# Patient Record
Sex: Male | Born: 1985
Health system: Southern US, Community
[De-identification: ages and names within clinical notes are randomized; demographics above are authoritative.]

## PROBLEM LIST (undated history)

## (undated) DIAGNOSIS — C801 Malignant (primary) neoplasm, unspecified: Secondary | ICD-10-CM

---

## 2018-03-28 DIAGNOSIS — E6609 Other obesity due to excess calories: Secondary | ICD-10-CM | POA: Diagnosis not present

## 2018-03-28 DIAGNOSIS — Z6833 Body mass index (BMI) 33.0-33.9, adult: Secondary | ICD-10-CM | POA: Diagnosis not present

## 2018-03-28 DIAGNOSIS — Z0000 Encounter for general adult medical examination without abnormal findings: Secondary | ICD-10-CM | POA: Diagnosis not present

## 2018-11-20 DIAGNOSIS — Z6830 Body mass index (BMI) 30.0-30.9, adult: Secondary | ICD-10-CM | POA: Diagnosis not present

## 2018-11-20 DIAGNOSIS — K5900 Constipation, unspecified: Secondary | ICD-10-CM | POA: Diagnosis not present

## 2018-11-20 DIAGNOSIS — K4090 Unilateral inguinal hernia, without obstruction or gangrene, not specified as recurrent: Secondary | ICD-10-CM | POA: Diagnosis not present

## 2018-11-20 DIAGNOSIS — K469 Unspecified abdominal hernia without obstruction or gangrene: Secondary | ICD-10-CM | POA: Diagnosis not present

## 2018-11-21 ENCOUNTER — Other Ambulatory Visit (HOSPITAL_COMMUNITY): Payer: Self-pay | Admitting: Physician Assistant

## 2018-11-21 ENCOUNTER — Other Ambulatory Visit: Payer: Self-pay | Admitting: Physician Assistant

## 2018-11-21 DIAGNOSIS — K5900 Constipation, unspecified: Secondary | ICD-10-CM

## 2018-11-21 DIAGNOSIS — K4090 Unilateral inguinal hernia, without obstruction or gangrene, not specified as recurrent: Secondary | ICD-10-CM

## 2018-11-26 ENCOUNTER — Other Ambulatory Visit: Payer: Self-pay

## 2018-11-26 ENCOUNTER — Ambulatory Visit (HOSPITAL_COMMUNITY)
Admission: RE | Admit: 2018-11-26 | Discharge: 2018-11-26 | Disposition: A | Discharge status: Home / Self Care | Payer: BC Managed Care – PPO | Source: Ambulatory Visit | Attending: Physician Assistant | Admitting: Physician Assistant

## 2018-11-26 DIAGNOSIS — K4090 Unilateral inguinal hernia, without obstruction or gangrene, not specified as recurrent: Secondary | ICD-10-CM | POA: Diagnosis not present

## 2018-11-26 DIAGNOSIS — K5900 Constipation, unspecified: Secondary | ICD-10-CM | POA: Diagnosis not present

## 2020-02-15 IMAGING — CT CT ABDOMEN AND PELVIS WITHOUT CONTRAST
2 of 4 series · 15 of 46 positions shown, 17 images · non-contrast
Comparison: None.

CLINICAL DATA: Constipation x2 months, right groin pain, evaluate
for hernia

EXAM:
CT ABDOMEN AND PELVIS WITHOUT CONTRAST
TECHNIQUE: Multidetector CT imaging of the abdomen and pelvis was performed
following the standard protocol without IV contrast.

[Series 2: axial st · axial · 0.73mm/px · z∈[-563,-83]mm · 12 of 108 slices shown, 14 images]
[im 6/108  soft-tissue]
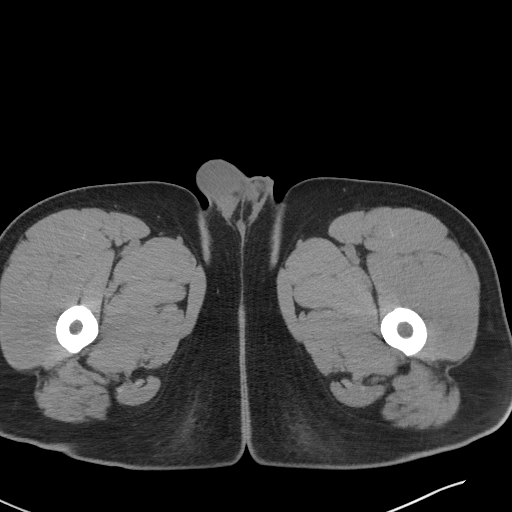
[im 6/108  bone]
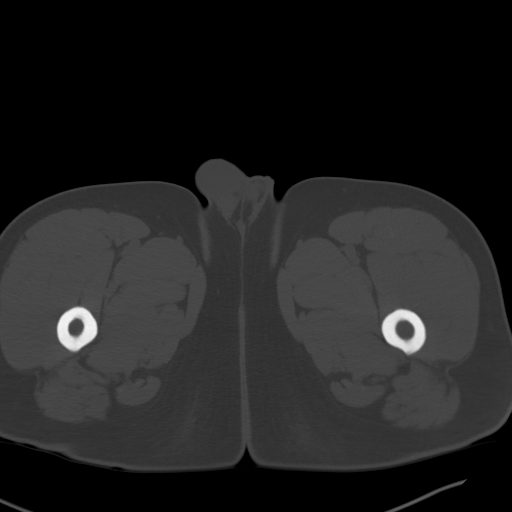
[im 17/108  soft-tissue]
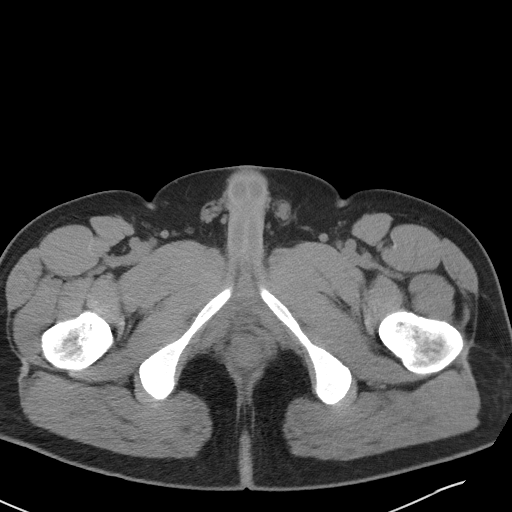
[im 22/108  soft-tissue]
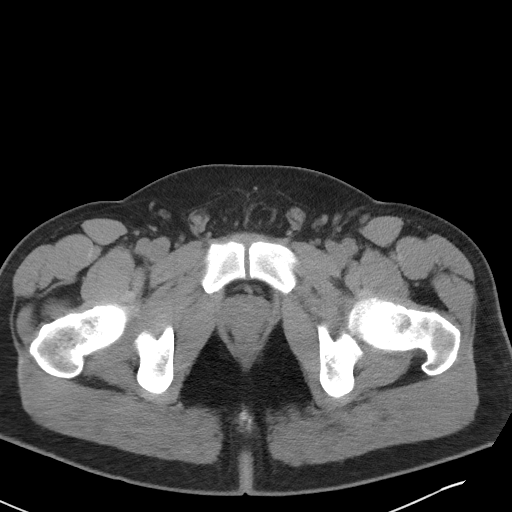
[im 33/108  soft-tissue]
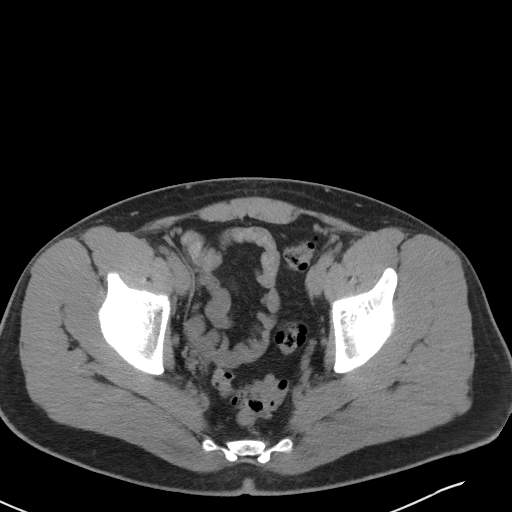
[im 43/108  soft-tissue]
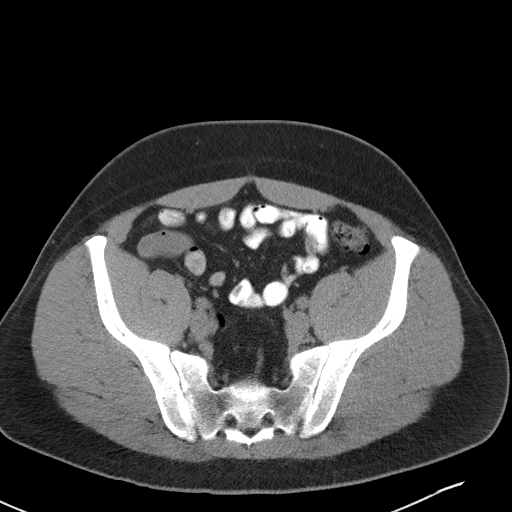
[im 49/108  soft-tissue]
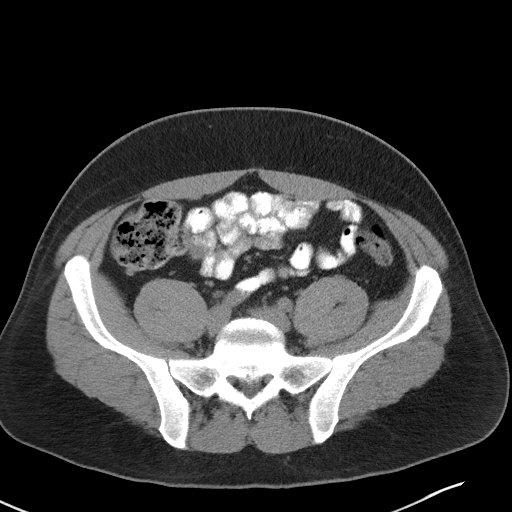
[im 59/108  soft-tissue]
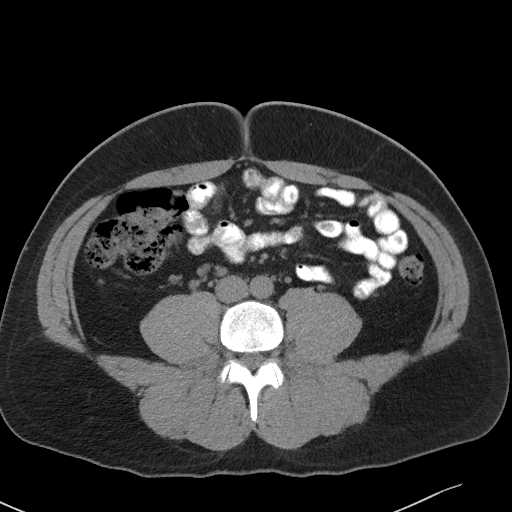
[im 65/108  soft-tissue]
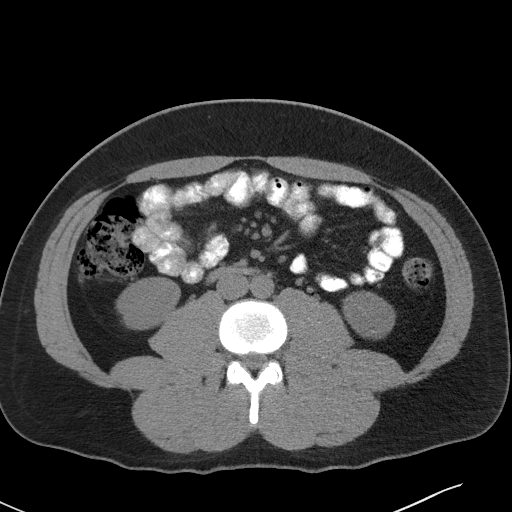
[im 75/108  soft-tissue]
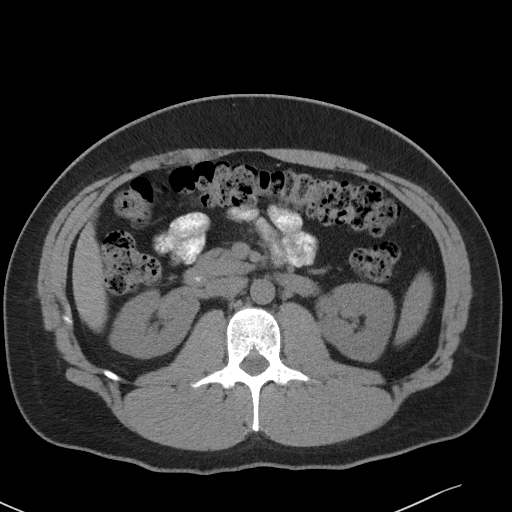
[im 75/108  bone]
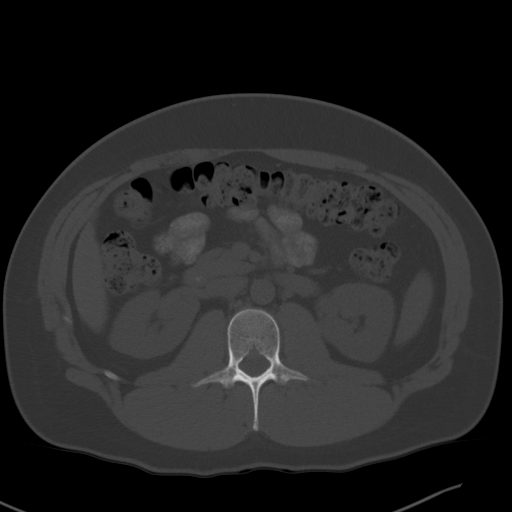
[im 86/108  soft-tissue]
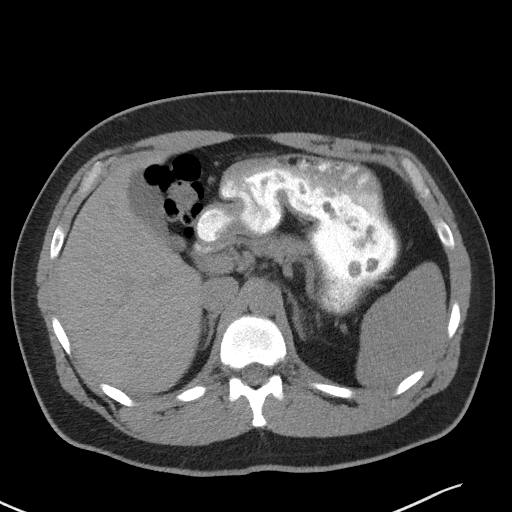
[im 91/108  soft-tissue]
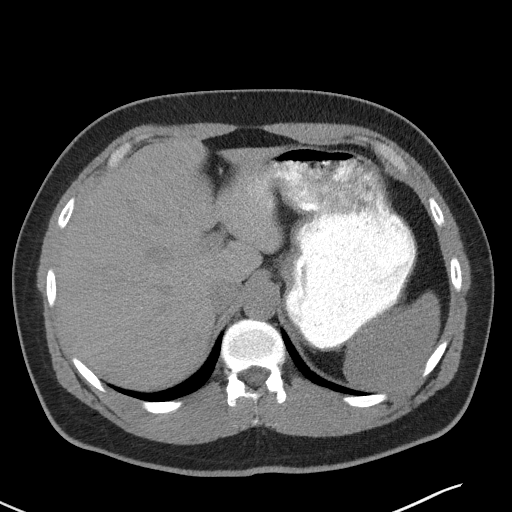
[im 102/108  soft-tissue]
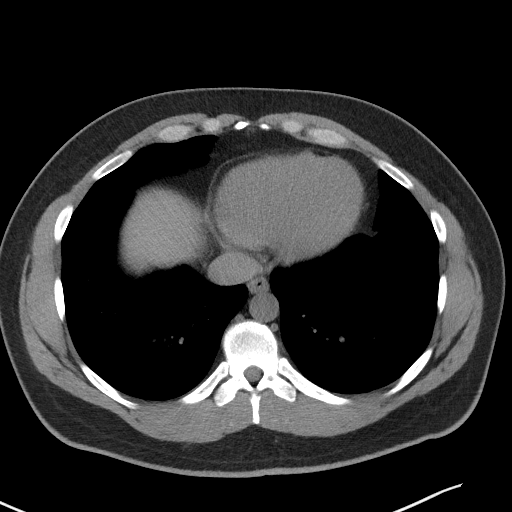

[Series 5: coronal st · coronal · 0.67mm/px · 3 of 95 slices shown]
[im 32/95  soft-tissue]
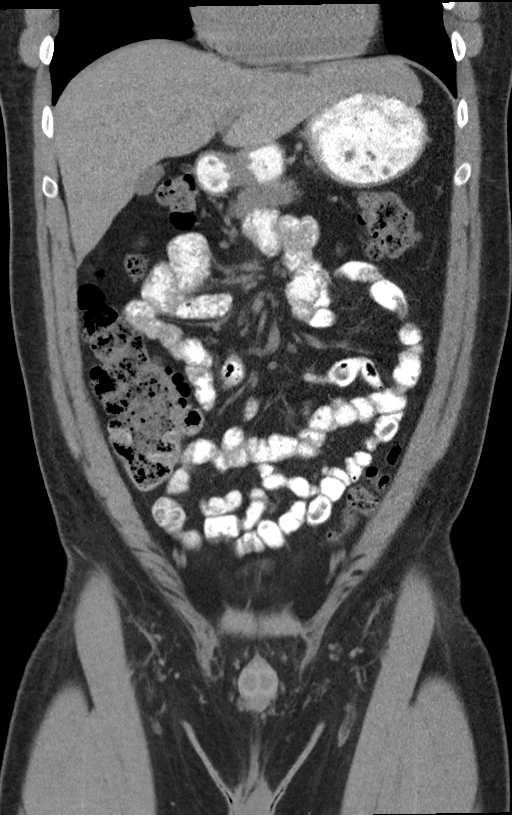
[im 42/95  soft-tissue]
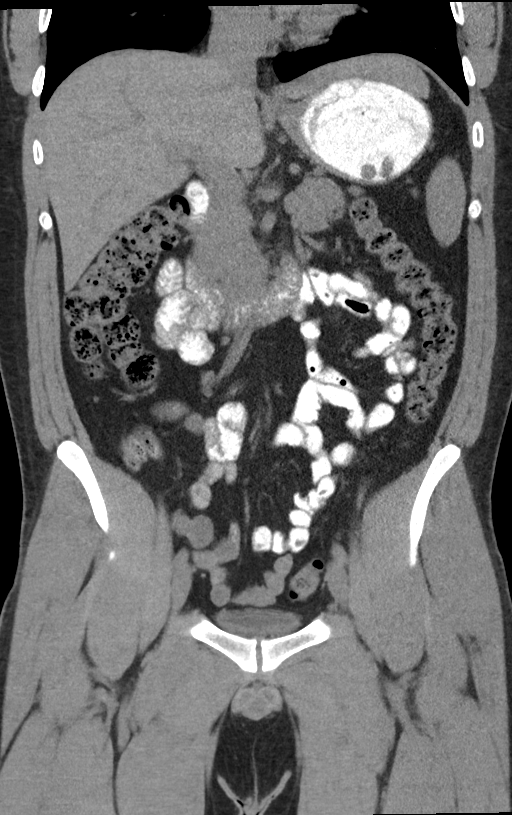
[im 53/95  soft-tissue]
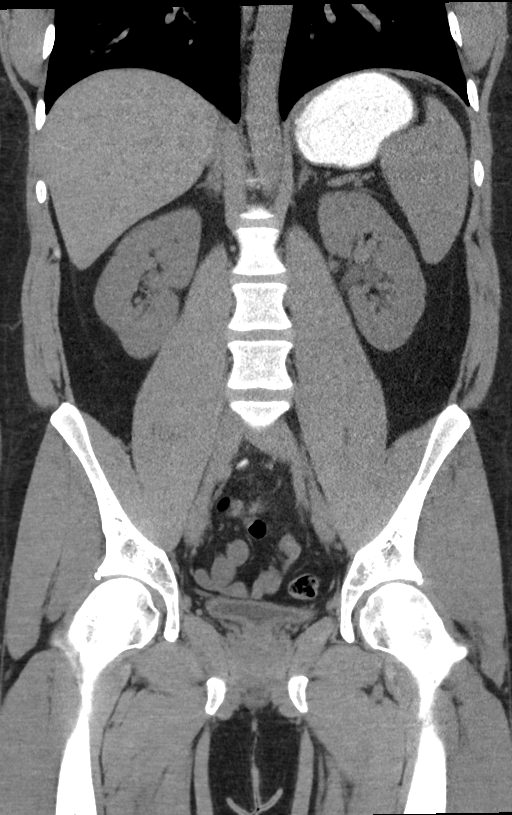

[15 of 46 positions shown; findings below may reference images not displayed]

FINDINGS: Lower chest: Lung bases are clear.

Hepatobiliary: Unenhanced liver is unremarkable.

Gallbladder is unremarkable. No intrahepatic or extrahepatic ductal
dilatation.

Pancreas: Within normal limits.

Spleen: Within normal limits.

Adrenals/Urinary Tract: Adrenal glands are within normal limits.

Kidneys are within normal limits. No renal, ureteral, or bladder
calculi. No hydronephrosis.

Bladder is mildly thick-walled although underdistended.

Stomach/Bowel: Stomach is within normal limits.

No evidence of bowel obstruction.

Normal appendix (series 2/image 56).

No colonic wall thickening or inflammatory changes. Normal colonic
stool burden.

Vascular/Lymphatic: No evidence of abdominal aortic aneurysm.

No suspicious abdominopelvic lymphadenopathy.

Reproductive: Prostate is unremarkable.

Other: No abdominopelvic ascites.

No evidence of ventral or inguinal hernia.

Musculoskeletal: Visualized osseous structures are within normal
limits.
IMPRESSION: Negative CT abdomen/pelvis.

No evidence of ventral or inguinal hernia.

## 2020-08-25 ENCOUNTER — Other Ambulatory Visit: Payer: Self-pay

## 2020-08-25 ENCOUNTER — Encounter (HOSPITAL_COMMUNITY): Payer: Self-pay

## 2020-08-25 ENCOUNTER — Emergency Department (HOSPITAL_COMMUNITY)
Admission: EM | Admit: 2020-08-25 | Discharge: 2020-08-25 | Disposition: A | Discharge status: Other Acute Inpatient Hospital | Payer: 59 | Attending: Emergency Medicine | Admitting: Emergency Medicine

## 2020-08-25 DIAGNOSIS — R000 Tachycardia, unspecified: Secondary | ICD-10-CM | POA: Insufficient documentation

## 2020-08-25 DIAGNOSIS — D649 Anemia, unspecified: Secondary | ICD-10-CM | POA: Diagnosis not present

## 2020-08-25 DIAGNOSIS — R42 Dizziness and giddiness: Secondary | ICD-10-CM | POA: Diagnosis present

## 2020-08-25 LAB — URINALYSIS, ROUTINE W REFLEX MICROSCOPIC
Bilirubin Urine: NEGATIVE
Glucose, UA: NEGATIVE mg/dL
Ketones, ur: NEGATIVE mg/dL
Leukocytes,Ua: NEGATIVE
Nitrite: NEGATIVE
Protein, ur: 100 mg/dL — AB
Specific Gravity, Urine: 1.023 (ref 1.005–1.030)
pH: 6 (ref 5.0–8.0)

## 2020-08-25 LAB — CBC WITH DIFFERENTIAL/PLATELET
Abs Immature Granulocytes: 0 10*3/uL (ref 0.00–0.07)
Basophils Absolute: 1.5 10*3/uL — ABNORMAL HIGH (ref 0.0–0.1)
Basophils Relative: 2 %
Blasts: 40 %
Eosinophils Absolute: 0 10*3/uL (ref 0.0–0.5)
Eosinophils Relative: 0 %
HCT: 18.7 % — ABNORMAL LOW (ref 39.0–52.0)
Hemoglobin: 6 g/dL — CL (ref 13.0–17.0)
Lymphocytes Relative: 19 %
Lymphs Abs: 14.4 10*3/uL — ABNORMAL HIGH (ref 0.7–4.0)
MCH: 31.9 pg (ref 26.0–34.0)
MCHC: 32.1 g/dL (ref 30.0–36.0)
MCV: 99.5 fL (ref 80.0–100.0)
Monocytes Absolute: 27.3 10*3/uL — ABNORMAL HIGH (ref 0.1–1.0)
Monocytes Relative: 36 %
Neutro Abs: 2.3 10*3/uL (ref 1.7–7.7)
Neutrophils Relative %: 3 %
Platelets: 19 10*3/uL — CL (ref 150–400)
RBC: 1.88 MIL/uL — ABNORMAL LOW (ref 4.22–5.81)
RDW: 14.8 % (ref 11.5–15.5)
Smear Review: NORMAL
WBC: 75.9 10*3/uL (ref 4.0–10.5)
nRBC: 0.1 % (ref 0.0–0.2)

## 2020-08-25 LAB — DIFFERENTIAL
Abs Immature Granulocytes: 0 10*3/uL (ref 0.00–0.07)
Basophils Absolute: 0 10*3/uL (ref 0.0–0.1)
Basophils Relative: 0 %
Blasts: 42 %
Eosinophils Absolute: 1.6 10*3/uL — ABNORMAL HIGH (ref 0.0–0.5)
Eosinophils Relative: 2 %
Lymphocytes Relative: 17 %
Lymphs Abs: 13.7 10*3/uL — ABNORMAL HIGH (ref 0.7–4.0)
Monocytes Absolute: 27.4 10*3/uL — ABNORMAL HIGH (ref 0.1–1.0)
Monocytes Relative: 34 %
Neutro Abs: 4 10*3/uL (ref 1.7–7.7)
Neutrophils Relative %: 5 %

## 2020-08-25 LAB — CBC
HCT: 18.5 % — ABNORMAL LOW (ref 39.0–52.0)
Hemoglobin: 6 g/dL — CL (ref 13.0–17.0)
MCH: 32.1 pg (ref 26.0–34.0)
MCHC: 32.4 g/dL (ref 30.0–36.0)
MCV: 98.9 fL (ref 80.0–100.0)
Platelets: 18 10*3/uL — CL (ref 150–400)
RBC: 1.87 MIL/uL — ABNORMAL LOW (ref 4.22–5.81)
RDW: 14.7 % (ref 11.5–15.5)
WBC: 80.5 10*3/uL (ref 4.0–10.5)
nRBC: 0.1 % (ref 0.0–0.2)

## 2020-08-25 LAB — TYPE AND SCREEN
ABO/RH(D): O POS
Antibody Screen: NEGATIVE

## 2020-08-25 LAB — BASIC METABOLIC PANEL
Anion gap: 7 (ref 5–15)
BUN: 16 mg/dL (ref 6–20)
CO2: 26 mmol/L (ref 22–32)
Calcium: 8.9 mg/dL (ref 8.9–10.3)
Chloride: 104 mmol/L (ref 98–111)
Creatinine, Ser: 1.09 mg/dL (ref 0.61–1.24)
GFR, Estimated: >60 mL/min (ref >60)
Glucose, Bld: 113 mg/dL — ABNORMAL HIGH (ref 70–99)
Potassium: 4.1 mmol/L (ref 3.5–5.1)
Sodium: 137 mmol/L (ref 135–145)

## 2020-08-25 LAB — PATHOLOGIST SMEAR REVIEW

## 2020-08-25 LAB — POC OCCULT BLOOD, ED: Fecal Occult Bld: NEGATIVE

## 2020-08-25 NOTE — ED Provider Notes (Signed)
[facility] EMERGENCY DEPARTMENT Provider Note   CSN: [id number] Arrival date & time: 08/25/20  1103     History Chief Complaint  Patient presents with  . Dizziness   Zachary Henry is a 35 y.o. male without significant PMH presents to ER for evaluation of light headedness for the last few weeks.  Mostly on exertion when standing up, walking.  Has come close to passing out.  Also reports shortness of breath with exertion but attributes this to being overweight.  Went to his PCP Dr Collene Mares in [place] who obtained lab work yesterday and called him today to let him know that his Hgb was 6 and needed to come to the ER.  He was given 25 mg metoprolol for his heart rate that has been fluctuating from 90-130s with exertion.  He only took one does of metoprolol.  He is not on any other medicines. He denies fever greater than 100.4 but has felt subjective fevers.  No unintentional weight. No Cp, SOB, cough. No unexplained bleeding including gum bleeding, hematuria, blood in stool or melena.  Just recently noticed a bruise in inner left thigh but doesn't know how he got it.  He got scratched/bit by his cat weeks ago, wound in left thigh and top of right foot are healing.    HPI     History reviewed. No pertinent past medical history.  There are no problems to display for this patient.   History reviewed. No pertinent surgical history.     History reviewed. No pertinent family history.  Social History   Tobacco Use  . Smoking status: Never Smoker  . Smokeless tobacco: Never Used    Home Medications Prior to Admission medications   Not on File    Allergies    Patient has no allergy information on record.  Review of Systems   Review of Systems  Respiratory: Positive for shortness of breath.   Skin: Positive for wound.  Neurological: Positive for light-headedness.  All other systems reviewed and are negative.   Physical Exam Updated Vital Signs BP 137/87    Pulse (!) 106   Temp 99.5 F (37.5 C) (Oral)   Resp 14   Ht $R'5\' 9"'Km$  (1.753 m)   Wt 99.8 kg   SpO2 99%   BMI 32.49 kg/m   Physical Exam Vitals and nursing note reviewed.  Constitutional:      General: He is not in acute distress.    Appearance: He is well-developed.     Comments: Pallor  HENT:     Head: Normocephalic and atraumatic.     Right Ear: External ear normal.     Left Ear: External ear normal.     Nose: Nose normal.     Mouth/Throat:     Comments: Poor dentition throughout, multiple missing teeth and decay. Upper gumline with gingival hypertrophy noted. Single petechial lesion right buccal mucosa  Eyes:     General: No scleral icterus.    Conjunctiva/sclera: Conjunctivae normal.  Cardiovascular:     Rate and Rhythm: Normal rate and regular rhythm.     Heart sounds: Normal heart sounds. No murmur heard.   Pulmonary:     Effort: Pulmonary effort is normal.     Breath sounds: Normal breath sounds. No wheezing.  Abdominal:     Palpations: Abdomen is soft.     Tenderness: There is no abdominal tenderness.  Musculoskeletal:        General: No deformity. Normal range of motion.  Cervical back: Normal range of motion and neck supple.  Skin:    General: Skin is warm and dry.     Capillary Refill: Capillary refill takes less than 2 seconds.     Comments: Left medial thigh has non blanchable purpura, non tender.  Posterior left thigh and dorsal right foot with healing ecchymotic non tender abrasions (c/w patient's recent cat bite/scratch)  Neurological:     Mental Status: He is alert and oriented to person, place, and time.  Psychiatric:        Behavior: Behavior normal.        Thought Content: Thought content normal.        Judgment: Judgment normal.     ED Results / Procedures / Treatments   Labs (all labs ordered are listed, but only abnormal results are displayed) Labs Reviewed  BASIC METABOLIC PANEL - Abnormal; Notable for the following components:       Result Value   Glucose, Bld 113 (*)    All other components within normal limits  CBC - Abnormal; Notable for the following components:   WBC 80.5 (*)    RBC 1.87 (*)    Hemoglobin 6.0 (*)    HCT 18.5 (*)    Platelets 18 (*)    All other components within normal limits  URINALYSIS, ROUTINE W REFLEX MICROSCOPIC - Abnormal; Notable for the following components:   APPearance CLOUDY (*)    Hgb urine dipstick SMALL (*)    Protein, ur 100 (*)    Bacteria, UA MANY (*)    All other components within normal limits  PATHOLOGIST SMEAR REVIEW  CBC WITH DIFFERENTIAL/PLATELET  DIFFERENTIAL  POC OCCULT BLOOD, ED  TYPE AND SCREEN  ABO/RH    EKG EKG Interpretation  Date/Time:  Wednesday Aug 25 2020 11:07:45 EDT Ventricular Rate:  108 PR Interval:  146 QRS Duration: 80 QT Interval:  342 QTC Calculation: 458 R Axis:   62 Text Interpretation: Sinus tachycardia Otherwise normal ECG No old tracing to compare Confirmed by Deno Etienne [id number]) on 08/25/2020 11:25:49 AM   Radiology No results found.  Procedures .Critical Care Performed by: Kinnie Feil, PA-C Authorized by: Kinnie Feil, PA-C   Critical care provider statement:    Critical care time (minutes):  45   Critical care was necessary to treat or prevent imminent or life-threatening deterioration of the following conditions: acute leukemia.   Critical care was time spent personally by me on the following activities:  Discussions with consultants, evaluation of patient's response to treatment, examination of patient, ordering and performing treatments and interventions, ordering and review of laboratory studies, ordering and review of radiographic studies, pulse oximetry, re-evaluation of patient's condition, obtaining history from patient or surrogate, review of old charts and development of treatment plan with patient or surrogate   I assumed direction of critical care for this patient from another provider in my specialty:  no       Medications Ordered in ED Medications - No data to display  ED Course  I have reviewed the triage vital signs and the nursing notes.  Pertinent labs & imaging results that were available during my care of the patient were reviewed by me and considered in my medical decision making (see chart for details).  Clinical Course as of 08/25/20 1506  Wed Aug 25, 2020  1318 Spoke to pathologist - smear WBC 80, normocytic anemia, thrombocytopenia. Monocytosis increased blasts. Needs bone marrow biopsy. Flocytometry will be held off in case he  go[facility] so it can be done there.  Findings worrisome for leukemia. [phone]. Dr Jori Moll  [CG]  [phone] Spoke to [facility] Dr Larose Hires  [CG]  [phone] Pathologist smear review Marked abnormal monocytosis with increased blasts/promonocytes (approximately 30%). Normocytic anemia and thrombocytopenia. Bone marrow evaluation and flow cytometric analysis is recommended. Reported to Carmon Sails on 08/25/20 at 1325 by S. O'Neill. [CG]  1432 50-80% monocytes  [CG]  1446 Spoke to Dr Lissa Merlin who accepts transfer to leukemia service [CG]  1453 Updated patient on planned [facility] leukemia service, Dr Lissa Merlin accepted  [CG]    Clinical Course User Index [CG] Arlean Hopping   MDM Rules/Calculators/A&P                          35 y.o. yo male presents to the ED for evaluation of lightheadedness for weeks.  Had an abnormal lab by PCP yesterday.  Additional information obtained from chart, nursing and triage notes review  Chart review reveals -no recent available medical records pertinent  Ordered lab, imaging were personally reviewed and interpreted  Labs reveal -WBC 80.5, hemoglobin 6.0, platelets 18.  CBG 114. Electrolytes otherwise unremarkable. Hemoccult negative. UA with many bacteria, but no leukocytes, nitrites, WBC, RBCs.   Imaging reveals -EKG reveals sinus tachycardia, no ischemic changes, arrhythmias.   Medicines ordered - none  ED course & MDM    1318: Pathologist Dr. Davina Poke called me and reported patient has marked abnormal monocytosis with increased blasts approximately 30%, normocytic anemia, thrombocytopenia and recommending bone marrow biopsy and flow cytometry.  Findings are suspicious for acute leukemia.  I spoke to lab tech several times regarding pending differential.  They stated that because the sample had so many blasts they were not able to count them and they could only provide the pathologist report.  They called me back and stated that they will "try" to have 2 techs try to do the count.  They could not tell me approximately how long this would take.   1345: I spoke to Dr. Earlie Server with hematology/oncology.  Agrees labs today are concerning for acute leukemia.  He recommends stat transfer to [facility] for further work-up.  He does not recommend any additional blood work, imaging or medicines in the emergency department.  1455: I spoke to Dr. Lissa Merlin [facility] oncology.  She is accepting patient transfer to leukemia service.  1500: Patient reevaluated.  Remains transiently tachycardic.  Temp 99.5, RN to recheck this.  Patient and wife updated on plan to transfer to [facility] for ongoing care.  I explained to them that his lab work is suspicious for acute leukemia but further work-up and evaluation is still pending.  Appropriately concerned.  Discussed with EDP Tyrone Nine.  Portions of this note were generated with [employer]. Dictation errors may occur despite best attempts at proofreading    Final Clinical Impression(s) / ED Diagnoses Final diagnoses:  Anemia, unspecified type    Rx / DC Orders ED Discharge Orders    None       Kinnie Feil, PA-C 08/25/20 [place], DO 08/25/20 1512

## 2020-08-25 NOTE — ED Notes (Signed)
Pt aware we need urine sample and urinal at bedside.

## 2020-08-25 NOTE — ED Triage Notes (Addendum)
Pt reports for the past month he has been experiencing  dizziness.Pt reports he went to his PCP multiple times and recently they decided to do blood work and his hemoglobin level was low. Pt reports dizziness, heart racing. Pt reports hemoglobin 6.2 per PCP

## 2020-09-02 ENCOUNTER — Ambulatory Visit (HOSPITAL_COMMUNITY): Payer: BC Managed Care – PPO | Admitting: Hematology

## 2020-10-07 ENCOUNTER — Encounter (HOSPITAL_COMMUNITY): Payer: Self-pay

## 2020-10-07 ENCOUNTER — Other Ambulatory Visit: Payer: Self-pay

## 2020-10-07 ENCOUNTER — Emergency Department (HOSPITAL_COMMUNITY)
Admission: EM | Admit: 2020-10-07 | Discharge: 2020-10-08 | Disposition: A | Discharge status: Home / Self Care | Payer: 59 | Attending: Emergency Medicine | Admitting: Emergency Medicine

## 2020-10-07 DIAGNOSIS — Z859 Personal history of malignant neoplasm, unspecified: Secondary | ICD-10-CM | POA: Diagnosis not present

## 2020-10-07 DIAGNOSIS — R1013 Epigastric pain: Secondary | ICD-10-CM | POA: Diagnosis not present

## 2020-10-07 DIAGNOSIS — R112 Nausea with vomiting, unspecified: Secondary | ICD-10-CM | POA: Insufficient documentation

## 2020-10-07 HISTORY — DX: Malignant (primary) neoplasm, unspecified: C80.1

## 2020-10-07 NOTE — ED Triage Notes (Signed)
Pt is currently taking chemo (day40-50) for leukemia, pt had port placed to right chest on Monday. Pt presents to ED from home with c/o vomiting that started yesterday, pt says vomiting stopped yesterday, then started back this morning and "feels like stomach is in a knot"

## 2020-10-08 ENCOUNTER — Emergency Department (HOSPITAL_COMMUNITY): Payer: 59

## 2020-10-08 MED ORDER — IOHEXOL 9 MG/ML PO SOLN
ORAL | Status: AC
  Filled 2020-10-08: qty 500

## 2020-10-08 MED ORDER — MORPHINE SULFATE (PF) 4 MG/ML IV SOLN: 4.0000 mg | Freq: Once | INTRAVENOUS | Status: DC

## 2020-10-08 MED ORDER — ALUM & MAG HYDROXIDE-SIMETH 200-200-20 MG/5ML PO SUSP: 30.0000 mL | Freq: Once | ORAL | Status: DC

## 2020-10-08 MED ORDER — ONDANSETRON HCL 4 MG/2ML IJ SOLN: 4.0000 mg | Freq: Once | INTRAMUSCULAR | Status: DC

## 2020-10-08 MED ORDER — PANTOPRAZOLE SODIUM 40 MG IV SOLR: 40.0000 mg | Freq: Once | INTRAVENOUS | Status: DC

## 2020-10-08 NOTE — ED Notes (Signed)
Pt says he wants to leave and go to [facility] as all his care has been done there- Dr Roxanne Mins made aware and getting paperwork together for pt to be discharged.

## 2020-10-08 NOTE — ED Notes (Signed)
Several attempts for PIV access, one attempt with [place] unsuccessful- DR Roxanne Mins aware Per Dr Roxanne Mins okay to access power port.

## 2020-10-08 NOTE — Discharge Instructions (Addendum)
You have left before we could do any testing. You need to have your abdominal pain properly evaluated.

## 2020-10-08 NOTE — ED Provider Notes (Signed)
[facility] EMERGENCY DEPARTMENT Provider Note   CSN: [id number] Arrival date & time: 10/07/20  2116     History Chief Complaint  Patient presents with   Emesis    Alixander Rallis is a 35 y.o. male.  The history is provided by the patient.  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  Pain was present yesterday morning and improved after vomiting and was okay through the day and recurred this morning and has persisted.  Currently rates pain at 6/10.  There is no radiation of pain.  He has had ongoing nausea and vomiting.  He denies fever or chills.  There has been no treatment at home.  Nothing makes the pain better, nothing makes it worse.   Past Medical History:  Diagnosis Date   Cancer ([place])     There are no problems to display for this patient.   History reviewed. No pertinent surgical history.     No family history on file.  Social History   Tobacco Use   Smoking status: Never   Smokeless tobacco: Never  Substance Use Topics   Alcohol use: Never   Drug use: Never    Home Medications Prior to Admission medications   Not on File    Allergies    Codeine  Review of Systems   Review of Systems  Gastrointestinal:  Positive for vomiting.  All other systems reviewed and are negative.  Physical Exam Updated Vital Signs BP 121/86   Pulse 80   Temp 98.5 F (36.9 C) (Oral)   Resp (!) 27   Ht 5\' 11"  (1.803 m)   Wt 99.8 kg   SpO2 95%   BMI 30.68 kg/m   Physical Exam Vitals and nursing note reviewed.  35 year old male, appears uncomfortable, but is in no acute distress. Vital signs are significant for elevated respiratory rate. Oxygen saturation is 95%, which is normal. Head is normocephalic and atraumatic. PERRLA, EOMI. Oropharynx is clear. Neck is nontender and supple without adenopathy or JVD. Back is nontender and there is no CVA  tenderness. Lungs are clear without rales, wheezes, or rhonchi. Chest is nontender.  Mediport present in the right side. Heart has regular rate and rhythm without murmur. Abdomen is soft, flat, with moderate epigastric tenderness.  There is no rebound or guarding.  There are no masses or hepatosplenomegaly and peristalsis is hypoactive. Extremities have no cyanosis or edema, full range of motion is present. Skin is pale, warm and dry without rash. Neurologic: Mental status is normal, cranial nerves are intact, there are no motor or sensory deficits.  ED Results / Procedures / Treatments   Labs (all labs ordered are listed, but only abnormal results are displayed) Labs Reviewed  LIPASE, BLOOD  COMPREHENSIVE METABOLIC PANEL  CBC  URINALYSIS, ROUTINE W REFLEX MICROSCOPIC    EKG EKG Interpretation  Date/Time:  Thursday October 07 2020 23:52:48 EDT Ventricular Rate:  100 PR Interval:  158 QRS Duration: 95 QT Interval:  363 QTC Calculation: 469 R Axis:   26 Text Interpretation: Sinus tachycardia Borderline prolonged QT interval When compared with ECG of 08/25/2020, No significant change was found Confirmed by Delora Fuel ([id number]) on 10/08/2020 12:07:20 AM  Radiology No results found.  Procedures Procedures   Medications Ordered in ED Medications - No data to display  ED Course  I have reviewed the triage vital signs and the nursing notes.  Pertinent labs & imaging results that were available during my care of the patient were reviewed by me and considered in my medical decision making (see chart for details).   MDM Rules/Calculators/A&P                         Epigastric pain of uncertain cause.  It seems most likely to be peptic ulcer disease, consider pancreatitis, cholecystitis.  Old records are reviewed and he did have recent induction therapy for leukemia but counts had rebounded well and it seems unlikely to be related to his leukemia.  Work-up is ordered including labs, CT  scan as well as treatment with antiacid and pantoprazole.  There was difficulty obtaining IV access and his port was just recently inserted and he did not feel comfortable having had accessed here and patient has requested to be discharged so that he could go to [facility] where his leukemia care has been done.  I have offered to have him transferred by ambulance and he has declined.  Final Clinical Impression(s) / ED Diagnoses Final diagnoses:  Epigastric pain    Rx / DC Orders ED Discharge Orders     None        Delora Fuel, MD 15/40/08 [phone]

## 2023-03-30 ENCOUNTER — Encounter (HOSPITAL_COMMUNITY): Payer: Self-pay

## 2023-04-05 ENCOUNTER — Encounter (HOSPITAL_COMMUNITY)
Admission: RE | Admit: 2023-04-05 | Discharge: 2023-04-05 | Disposition: A | Discharge status: Home / Self Care | Payer: 59 | Source: Ambulatory Visit | Attending: Hematology and Oncology | Admitting: Hematology and Oncology

## 2023-04-05 VITALS — Wt 229.3 lb

## 2023-04-05 DIAGNOSIS — J84116 Cryptogenic organizing pneumonia: Secondary | ICD-10-CM | POA: Diagnosis present

## 2023-04-05 DIAGNOSIS — J9601 Acute respiratory failure with hypoxia: Secondary | ICD-10-CM | POA: Diagnosis present

## 2023-04-05 NOTE — Progress Notes (Signed)
Pulmonary Individual Treatment Plan  Patient Details  Name: Zachary Henry MRN: [id number] Date of Birth: September 21, 1985 Referring Provider:   Flowsheet Row PULMONARY REHAB OTHER RESP ORIENTATION from 04/05/2023 in [facility] CARDIAC REHABILITATION  Referring Provider wofford, anne MD       Initial Encounter Date:  Flowsheet Row PULMONARY REHAB OTHER RESP ORIENTATION from 04/05/2023 in [facility] CARDIAC REHABILITATION  Date 04/05/23       Visit Diagnosis: Acute respiratory failure with hypoxia (HCC)  Cryptogenic organizing pneumonia (HCC)  Patient's Home Medications on Admission:   Current Outpatient Medications:    acetaminophen (TYLENOL) 325 MG tablet, Take 650 mg by mouth every 4 (four) hours as needed for moderate pain (pain score 4-6)., Disp: , Rfl:    acyclovir (ZOVIRAX) 800 MG tablet, Take 800 mg by mouth 2 (two) times daily., Disp: , Rfl:    albuterol (VENTOLIN HFA) 108 (90 Base) MCG/ACT inhaler, Inhale 2 puffs into the lungs every 4 (four) hours as needed (SOB)., Disp: , Rfl:    chlorhexidine (PERIDEX) 0.12 % solution, Use as directed 5 mLs in the mouth or throat 2 (two) times daily., Disp: , Rfl:    CRESEMBA 186 MG CAPS, Take 2 capsules by mouth daily., Disp: , Rfl:    famotidine (PEPCID) 20 MG tablet, Take 20 mg by mouth daily as needed for heartburn., Disp: , Rfl:    fluticasone-salmeterol (ADVAIR) 250-50 MCG/ACT AEPB, Inhale 1 puff into the lungs in the morning and at bedtime., Disp: , Rfl:    folic acid (FOLVITE) 1 MG tablet, Take 1 mg by mouth daily., Disp: , Rfl:    Insulin Glargine (BASAGLAR KWIKPEN) 100 UNIT/ML, Inject 12 Units into the skin 2 (two) times daily., Disp: , Rfl:    insulin lispro (HUMALOG) 100 UNIT/ML KwikPen, Inject 6 Units into the skin 3 (three) times daily. Sliding scale, Disp: , Rfl:    metoprolol succinate (TOPROL-XL) 25 MG 24 hr tablet, Take 1 tablet by mouth daily., Disp: , Rfl:    Multiple Vitamin (MULTI-VITAMIN) tablet, Take 1 tablet by  mouth daily., Disp: , Rfl:    ondansetron (ZOFRAN-ODT) 8 MG disintegrating tablet, Take 8 mg by mouth every 8 (eight) hours as needed for nausea., Disp: , Rfl:    oxymetazoline (AFRIN) 0.05 % nasal spray, Place 2 sprays into the nose 2 (two) times daily as needed (Nose bleed)., Disp: , Rfl:    penicillin v potassium (VEETID) 500 MG tablet, Take 500 mg by mouth 2 (two) times daily., Disp: , Rfl:    predniSONE (DELTASONE) 10 MG tablet, Take 80 mg by mouth 2 (two) times daily., Disp: , Rfl:    PREVYMIS 480 MG TABS, Take 1 tablet by mouth daily., Disp: , Rfl:    SODIUM FLUORIDE 5000 PPM 1.1 % GEL dental gel, Place 1 Application onto teeth at bedtime., Disp: , Rfl:    Specialty Vitamins Products (MG PLUS PROTEIN) 133 MG TABS, Take 133 mg by mouth daily., Disp: , Rfl:    sulfamethoxazole-trimethoprim (BACTRIM DS) 800-160 MG tablet, Take 1 tablet by mouth 3 (three) times a week., Disp: , Rfl:    tacrolimus (PROGRAF) 0.5 MG capsule, Take 1 mg by mouth 2 (two) times daily., Disp: , Rfl:   Past Medical History: Past Medical History:  Diagnosis Date   Cancer (HCC)     Tobacco Use: Social History   Tobacco Use  Smoking Status Never  Smokeless Tobacco Never    Labs: Review Flowsheet  No data to display          Capillary Blood Glucose: No results found for: "GLUCAP"   Pulmonary Assessment Scores:  Pulmonary Assessment Scores     Row Name 04/05/23 1251         ADL UCSD   ADL Phase Entry     SOB Score total 19     Rest 0     Walk 1     Stairs 2     Bath 1     Dress 2     Shop 2       CAT Score   CAT Score 4       mMRC Score   mMRC Score 1             UCSD: Self-administered rating of dyspnea associated with activities of daily living (ADLs) 6-point scale (0 = "not at all" to 5 = "maximal or unable to do because of breathlessness")  Scoring Scores range from 0 to 120.  Minimally important difference is 5 units  CAT: CAT can identify the health impairment  of COPD patients and is better correlated with disease progression.  CAT has a scoring range of zero to 40. The CAT score is classified into four groups of low (less than 10), medium (10 - 20), high (21-30) and very high (31-40) based on the impact level of disease on health status. A CAT score over 10 suggests significant symptoms.  A worsening CAT score could be explained by an exacerbation, poor medication adherence, poor inhaler technique, or progression of COPD or comorbid conditions.  CAT MCID is 2 points  mMRC: mMRC (Modified Medical Research Council) Dyspnea Scale is used to assess the degree of baseline functional disability in patients of respiratory disease due to dyspnea. No minimal important difference is established. A decrease in score of 1 point or greater is considered a positive change.   Pulmonary Function Assessment:   Exercise Target Goals: Exercise Program Goal: Individual exercise prescription set using results from initial 6 min walk test and THRR while considering  patient's activity barriers and safety.   Exercise Prescription Goal: Initial exercise prescription builds to 30-45 minutes a day of aerobic activity, 2-3 days per week.  Home exercise guidelines will be given to patient during program as part of exercise prescription that the participant will acknowledge.  Activity Barriers & Risk Stratification:  Activity Barriers & Cardiac Risk Stratification - 04/05/23 1333       Activity Barriers & Cardiac Risk Stratification   Activity Barriers Shortness of Breath;Deconditioning;Muscular Weakness;Assistive Device;Balance Concerns    Cardiac Risk Stratification Low             6 Minute Walk:  6 Minute Walk     Row Name 04/05/23 1616         6 Minute Walk   Phase Initial     Distance 750 feet     Walk Time 6 minutes     # of Rest Breaks 0     MPH 1.42     METS 1.42     RPE 12     Perceived Dyspnea  2     VO2 Peak 14.36     Symptoms No      Resting HR 115 bpm     Resting BP 116/90     Resting Oxygen Saturation  96 %     Exercise Oxygen Saturation  during 6 min walk 94 %     Max Ex.  HR 154 bpm     Max Ex. BP 140/90     2 Minute Post BP 128/86       Interval HR   1 Minute HR 144     2 Minute HR 148     3 Minute HR 152     4 Minute HR 152     5 Minute HR 148     6 Minute HR 154     2 Minute Post HR 123     Interval Heart Rate? Yes       Interval Oxygen   Interval Oxygen? Yes     Baseline Oxygen Saturation % 96 %     1 Minute Oxygen Saturation % 94 %     1 Minute Liters of Oxygen 0 L     2 Minute Oxygen Saturation % 94 %     2 Minute Liters of Oxygen 0 L     3 Minute Oxygen Saturation % 95 %     3 Minute Liters of Oxygen 0 L     4 Minute Oxygen Saturation % 95 %     4 Minute Liters of Oxygen 0 L     5 Minute Oxygen Saturation % 94 %     5 Minute Liters of Oxygen 0 L     6 Minute Oxygen Saturation % 94 %     6 Minute Liters of Oxygen 0 L     2 Minute Post Oxygen Saturation % 95 %     2 Minute Post Liters of Oxygen 0 L              Oxygen Initial Assessment:  Oxygen Initial Assessment - 04/05/23 1335       Home Oxygen   Home Oxygen Device Home Concentrator;Portable Concentrator    Sleep Oxygen Prescription --   As Needed   Compliance with Home Oxygen Use Yes      Intervention   Short Term Goals To learn and exhibit compliance with exercise, home and travel O2 prescription;To learn and understand importance of monitoring SPO2 with pulse oximeter and demonstrate accurate use of the pulse oximeter.;To learn and understand importance of maintaining oxygen saturations>88%;To learn and demonstrate proper pursed lip breathing techniques or other breathing techniques. ;To learn and demonstrate proper use of respiratory medications    Long  Term Goals Exhibits compliance with exercise, home  and travel O2 prescription;Verbalizes importance of monitoring SPO2 with pulse oximeter and return demonstration;Maintenance  of O2 saturations>88%;Exhibits proper breathing techniques, such as pursed lip breathing or other method taught during program session;Compliance with respiratory medication;Demonstrates proper use of MDI's             Oxygen Re-Evaluation:   Oxygen Discharge (Final Oxygen Re-Evaluation):   Initial Exercise Prescription:  Initial Exercise Prescription - 04/05/23 1600       Date of Initial Exercise RX and Referring Provider   Date 04/05/23    Referring Provider wofford, anne MD      Oxygen   Oxygen Continuous    Maintain Oxygen Saturation 88% or higher      NuStep   Level 1    SPM 50    Minutes 15    METs 1.9      REL-XR   Level 1    Speed 50    Minutes 15    METs 1.8      Prescription Details   Frequency (times per week) 3    Duration Progress to 30  minutes of continuous aerobic without signs/symptoms of physical distress      Intensity   THRR 40-80% of Max Heartrate 143-170    Ratings of Perceived Exertion 11-13    Perceived Dyspnea 0-4             Perform Capillary Blood Glucose checks as needed.  Exercise Prescription Changes:   Exercise Prescription Changes     Row Name 04/05/23 1600             Response to Exercise   Blood Pressure (Admit) 116/90       Blood Pressure (Exercise) 140/90       Blood Pressure (Exit) 128/86       Heart Rate (Admit) 115 bpm       Heart Rate (Exercise) 154 bpm       Heart Rate (Exit) 123 bpm       Oxygen Saturation (Admit) 96 %       Oxygen Saturation (Exercise) 94 %       Oxygen Saturation (Exit) 95 %       Rating of Perceived Exertion (Exercise) 12       Perceived Dyspnea (Exercise) 2         Resistance Training   Weight 3lbs       Reps 10-15                Exercise Comments:   Exercise Goals and Review:   Exercise Goals     Row Name 04/05/23 1621             Exercise Goals   Increase Physical Activity Yes       Intervention Develop an individualized exercise prescription for  aerobic and resistive training based on initial evaluation findings, risk stratification, comorbidities and participant's personal goals.;Provide advice, education, support and counseling about physical activity/exercise needs.       Expected Outcomes Short Term: Attend rehab on a regular basis to increase amount of physical activity.;Long Term: Exercising regularly at least 3-5 days a week.;Long Term: Add in home exercise to make exercise part of routine and to increase amount of physical activity.       Increase Strength and Stamina Yes       Intervention Develop an individualized exercise prescription for aerobic and resistive training based on initial evaluation findings, risk stratification, comorbidities and participant's personal goals.;Provide advice, education, support and counseling about physical activity/exercise needs.       Expected Outcomes Short Term: Increase workloads from initial exercise prescription for resistance, speed, and METs.;Long Term: Improve cardiorespiratory fitness, muscular endurance and strength as measured by increased METs and functional capacity ( );Short Term: Perform resistance training exercises routinely during rehab and add in resistance training at home       Able to understand and use rate of perceived exertion (RPE) scale Yes       Intervention Provide education and explanation on how to use RPE scale       Expected Outcomes Short Term: Able to use RPE daily in rehab to express subjective intensity level;Long Term:  Able to use RPE to guide intensity level when exercising independently       Able to understand and use Dyspnea scale Yes       Intervention Provide education and explanation on how to use Dyspnea scale       Expected Outcomes Short Term: Able to use Dyspnea scale daily in rehab to express subjective sense of shortness of breath during exertion;Long Term: Able  to use Dyspnea scale to guide intensity level when exercising independently        Knowledge and understanding of Target Heart Rate Range (THRR) Yes       Intervention Provide education and explanation of THRR including how the numbers were predicted and where they are located for reference       Expected Outcomes Short Term: Able to state/look up THRR;Long Term: Able to use THRR to govern intensity when exercising independently;Short Term: Able to use daily as guideline for intensity in rehab       Able to check pulse independently Yes       Intervention Provide education and demonstration on how to check pulse in carotid and radial arteries.;Review the importance of being able to check your own pulse for safety during independent exercise       Expected Outcomes Short Term: Able to explain why pulse checking is important during independent exercise;Long Term: Able to check pulse independently and accurately       Understanding of Exercise Prescription Yes       Intervention Provide education, explanation, and written materials on patient's individual exercise prescription       Expected Outcomes Short Term: Able to explain program exercise prescription;Long Term: Able to explain home exercise prescription to exercise independently                Exercise Goals Re-Evaluation :   Discharge Exercise Prescription (Final Exercise Prescription Changes):  Exercise Prescription Changes - 04/05/23 1600       Response to Exercise   Blood Pressure (Admit) 116/90    Blood Pressure (Exercise) 140/90    Blood Pressure (Exit) 128/86    Heart Rate (Admit) 115 bpm    Heart Rate (Exercise) 154 bpm    Heart Rate (Exit) 123 bpm    Oxygen Saturation (Admit) 96 %    Oxygen Saturation (Exercise) 94 %    Oxygen Saturation (Exit) 95 %    Rating of Perceived Exertion (Exercise) 12    Perceived Dyspnea (Exercise) 2      Resistance Training   Weight 3lbs    Reps 10-15             Nutrition:  Target Goals: Understanding of nutrition guidelines, daily intake of sodium 1500mg ,  cholesterol 200mg , calories 30% from fat and 7% or less from saturated fats, daily to have 5 or more servings of fruits and vegetables.  Biometrics:  Pre Biometrics - 04/05/23 1621       Pre Biometrics   Weight 104 kg              Nutrition Therapy Plan and Nutrition Goals:   Nutrition Assessments:  MEDIFICTS Score Key: >=70 Need to make dietary changes  40-70 Heart Healthy Diet <= 40 Therapeutic Level Cholesterol Diet  Flowsheet Row PULMONARY REHAB OTHER RESP ORIENTATION from 04/05/2023 in [facility] CARDIAC REHABILITATION  Picture Your Plate Total Score on Admission 57      Picture Your Plate Scores: <16 Unhealthy dietary pattern with much room for improvement. 41-50 Dietary pattern unlikely to meet recommendations for good health and room for improvement. 51-60 More healthful dietary pattern, with some room for improvement.  >60 Healthy dietary pattern, although there may be some specific behaviors that could be improved.    Nutrition Goals Re-Evaluation:   Nutrition Goals Discharge (Final Nutrition Goals Re-Evaluation):   Psychosocial: Target Goals: Acknowledge presence or absence of significant depression and/or stress, maximize coping skills, provide positive support system.  Participant is able to verbalize types and ability to use techniques and skills needed for reducing stress and depression.  Initial Review & Psychosocial Screening:  Initial Psych Review & Screening - 04/05/23 1500       Initial Review   Current issues with None Identified      Family Dynamics   Good Support System? Yes    Comments His parents and wife are his support people.      Barriers   Psychosocial barriers to participate in program The patient should benefit from training in stress management and relaxation.;There are no identifiable barriers or psychosocial needs.      Screening Interventions   Interventions Encouraged to exercise;To provide support and resources with  identified psychosocial needs;Provide feedback about the scores to participant    Expected Outcomes Short Term goal: Utilizing psychosocial counselor, staff and physician to assist with identification of specific Stressors or current issues interfering with healing process. Setting desired goal for each stressor or current issue identified.;Long Term Goal: Stressors or current issues are controlled or eliminated.;Short Term goal: Identification and review with participant of any Quality of Life or Depression concerns found by scoring the questionnaire.;Long Term goal: The participant improves quality of Life and PHQ9 Scores as seen by post scores and/or verbalization of changes             Quality of Life Scores:  Scores of 19 and below usually indicate a poorer quality of life in these areas.  A difference of  2-3 points is a clinically meaningful difference.  A difference of 2-3 points in the total score of the Quality of Life Index has been associated with significant improvement in overall quality of life, self-image, physical symptoms, and general health in studies assessing change in quality of life.   PHQ-9: Review Flowsheet       04/05/2023  Depression screen PHQ 2/9  Decreased Interest 0  Down, Depressed, Hopeless 0  PHQ - 2 Score 0  Altered sleeping 3  Tired, decreased energy 1  Change in appetite 0  Feeling bad or failure about yourself  0  Trouble concentrating 0  Moving slowly or fidgety/restless 0  Suicidal thoughts 0  PHQ-9 Score 4  Difficult doing work/chores Not difficult at all   Interpretation of Total Score  Total Score Depression Severity:  1-4 = Minimal depression, 5-9 = Mild depression, 10-14 = Moderate depression, 15-19 = Moderately severe depression, 20-27 = Severe depression   Psychosocial Evaluation and Intervention:  Psychosocial Evaluation - 04/05/23 1501       Psychosocial Evaluation & Interventions   Interventions Stress management  education;Relaxation education;Encouraged to exercise with the program and follow exercise prescription    Comments Patient referred to PR with acute respiratory failure with hypoxia and pneumonia from [facility]. His PHQ-9 score was 4 due to trouble staying asleep and having little energy. Patient denies any depression, anxiety or major stressors in his life. 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 He went into remission after 6 months of chemotherapy and had a relapse in 1/24. He received a bone marrow transplant 6/24 and is in remission. He was doing well until developing pneumonia in November with acute respiratory failure requiring intubation and was hospitalized for 28 days. He requires w/c for long distance due to SOB and lower extremity muscle weakness from being immobile for a month. He had home PT and was discharged a few days ago but still does the  exercises.  He is in good spirits and says he is thankful to be alive. He lives with his wife and they have 2 children. 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 He is very motivated to participate in PR. His goals for the program are to breathe better; improve his overall mobility and balance; and be independent and get back to normal. He has no barriers identified to complete the program.    Expected Outcomes Short Term: Start the program and attend consistently. Long Term: meet his personal goals.    Continue Psychosocial Services  Follow up required by staff             Psychosocial Re-Evaluation:   Psychosocial Discharge (Final Psychosocial Re-Evaluation):    Education: Education Goals: Education classes will be provided on a weekly basis, covering required topics. Participant will state understanding/return demonstration of topics presented.  Learning Barriers/Preferences:  Learning Barriers/Preferences  - 04/05/23 1340       Learning Barriers/Preferences   Learning Barriers None    Learning Preferences Written Material;Audio;Skilled Demonstration             Education Topics: How Lungs Work and Diseases: - Discuss the anatomy of the lungs and diseases that can affect the lungs, such as COPD.   Exercise: -Discuss the importance of exercise, FITT principles of exercise, normal and abnormal responses to exercise, and how to exercise safely.   Environmental Irritants: -Discuss types of environmental irritants and how to limit exposure to environmental irritants.   Meds/Inhalers and oxygen: - Discuss respiratory medications, definition of an inhaler and oxygen, and the proper way to use an inhaler and oxygen.   Energy Saving Techniques: - Discuss methods to conserve energy and decrease shortness of breath when performing activities of daily living.    Bronchial Hygiene / Breathing Techniques: - Discuss breathing mechanics, pursed-lip breathing technique,  proper posture, effective ways to clear airways, and other functional breathing techniques   Cleaning Equipment: - Provides group verbal and written instruction about the health risks of elevated stress, cause of high stress, and healthy ways to reduce stress.   Nutrition I: Fats: - Discuss the types of cholesterol, what cholesterol does to the body, and how cholesterol levels can be controlled.   Nutrition II: Labels: -Discuss the different components of food labels and how to read food labels.   Respiratory Infections: - Discuss the signs and symptoms of respiratory infections, ways to prevent respiratory infections, and the importance of seeking medical treatment when having a respiratory infection.   Stress I: Signs and Symptoms: - Discuss the causes of stress, how stress may lead to anxiety and depression, and ways to limit stress.   Stress II: Relaxation: -Discuss relaxation techniques to limit  stress.   Oxygen for Home/Travel: - Discuss how to prepare for travel when on oxygen and proper ways to transport and store oxygen to ensure safety.   Knowledge Questionnaire Score:  Knowledge Questionnaire Score - 04/05/23 1251       Knowledge Questionnaire Score   Pre Score 16/18             Core Components/Risk Factors/Patient Goals at Admission:  Personal Goals and Risk Factors at Admission - 04/05/23 1454       Core Components/Risk Factors/Patient Goals on Admission    Weight Management Weight Maintenance;Obesity    Improve shortness of breath with ADL's Yes    Intervention Provide education,  individualized exercise plan and daily activity instruction to help decrease symptoms of SOB with activities of daily living.    Expected Outcomes Short Term: Improve cardiorespiratory fitness to achieve a reduction of symptoms when performing ADLs;Long Term: Be able to perform more ADLs without symptoms or delay the onset of symptoms    Diabetes Yes    Intervention Provide education about signs/symptoms and action to take for hypo/hyperglycemia.;Provide education about proper nutrition, including hydration, and aerobic/resistive exercise prescription along with prescribed medications to achieve blood glucose in normal ranges: Fasting glucose 65-99 mg/dL    Expected Outcomes Short Term: Participant verbalizes understanding of the signs/symptoms and immediate care of hyper/hypoglycemia, proper foot care and importance of medication, aerobic/resistive exercise and nutrition plan for blood glucose control.;Long Term: Attainment of HbA1C < 7%.    Heart Failure Yes    Intervention Provide a combined exercise and nutrition program that is supplemented with education, support and counseling about heart failure. Directed toward relieving symptoms such as shortness of breath, decreased exercise tolerance, and extremity edema.    Expected Outcomes Improve functional capacity of life;Short term:  Attendance in program 2-3 days a week with increased exercise capacity. Reported lower sodium intake. Reported increased fruit and vegetable intake. Reports medication compliance.;Short term: Daily weights obtained and reported for increase. Utilizing diuretic protocols set by physician.;Long term: Adoption of self-care skills and reduction of barriers for early signs and symptoms recognition and intervention leading to self-care maintenance.    Hypertension Yes    Intervention Provide education on lifestyle modifcations including regular physical activity/exercise, weight management, moderate sodium restriction and increased consumption of fresh fruit, vegetables, and low fat dairy, alcohol moderation, and smoking cessation.;Monitor prescription use compliance.    Expected Outcomes Short Term: Continued assessment and intervention until BP is < 140/50mm HG in hypertensive participants. < 130/64mm HG in hypertensive participants with diabetes, heart failure or chronic kidney disease.;Long Term: Maintenance of blood pressure at goal levels.             Core Components/Risk Factors/Patient Goals Review:    Core Components/Risk Factors/Patient Goals at Discharge (Final Review):    ITP Comments:   Comments: Patient arrived for 1st visit/orientation/education at 1300. Patient was referred to P R by Dr. Carley Hammed due to Acute Respiratory Failure with hypoxia and pneumonia. During orientation advised patient on arrival and appointment times what to wear, what to do before, during and after exercise. Reviewed attendance and class policy.  Pt is scheduled to return Pulmonary Rehab on 04/16/23 at 915. Pt was advised to come to class 15 minutes before class starts.  Discussed RPE/Dpysnea scales. Patient participated in warm up stretches. Patient was able to complete 6 minute walk test.  Patient was measured for the equipment. Discussed equipment safety with patient. Took patient pre-anthropometric  measurements. Patient finished visit at 1430.

## 2023-04-06 NOTE — Patient Instructions (Addendum)
Patient Instructions  Patient Details  Name: Zachary Henry MRN: [id number] Date of Birth: 06-Sep-1985 Referring Provider:  Ricarda Frame*  Below are your personal goals for exercise, nutrition, and risk factors. Our goal is to help you stay on track towards obtaining and maintaining these goals. We will be discussing your progress on these goals with you throughout the program.  Initial Exercise Prescription:  Initial Exercise Prescription - 04/05/23 1600       Date of Initial Exercise RX and Referring Provider   Date 04/05/23    Referring Provider wofford, anne MD      Oxygen   Oxygen Continuous    Maintain Oxygen Saturation 88% or higher      NuStep   Level 1    SPM 50    Minutes 15    METs 1.9      REL-XR   Level 1    Speed 50    Minutes 15    METs 1.8      Prescription Details   Frequency (times per week) 3    Duration Progress to 30 minutes of continuous aerobic without signs/symptoms of physical distress      Intensity   THRR 40-80% of Max Heartrate 143-170    Ratings of Perceived Exertion 11-13    Perceived Dyspnea 0-4             Exercise Goals: Frequency: Be able to perform aerobic exercise two to three times per week in program working toward 2-5 days per week of home exercise.  Intensity: Work with a perceived exertion of 11 (fairly light) - 15 (hard) while following your exercise prescription.  We will make changes to your prescription with you as you progress through the program.   Duration: Be able to do 30 to 45 minutes of continuous aerobic exercise in addition to a 5 minute warm-up and a 5 minute cool-down routine.   Nutrition Goals: Your personal nutrition goals will be established when you do your nutrition analysis with the dietician.  The following are general nutrition guidelines to follow: Cholesterol < 200mg /day Sodium < 1500mg /day Fiber: Men under 50 yrs - 38 grams per day   Personal Goals:  Personal Goals and  Risk Factors at Admission - 04/05/23 1454       Core Components/Risk Factors/Patient Goals on Admission    Weight Management Weight Maintenance;Obesity    Improve shortness of breath with ADL's Yes    Intervention Provide education, individualized exercise plan and daily activity instruction to help decrease symptoms of SOB with activities of daily living.    Expected Outcomes Short Term: Improve cardiorespiratory fitness to achieve a reduction of symptoms when performing ADLs;Long Term: Be able to perform more ADLs without symptoms or delay the onset of symptoms    Diabetes Yes    Intervention Provide education about signs/symptoms and action to take for hypo/hyperglycemia.;Provide education about proper nutrition, including hydration, and aerobic/resistive exercise prescription along with prescribed medications to achieve blood glucose in normal ranges: Fasting glucose 65-99 mg/dL    Expected Outcomes Short Term: Participant verbalizes understanding of the signs/symptoms and immediate care of hyper/hypoglycemia, proper foot care and importance of medication, aerobic/resistive exercise and nutrition plan for blood glucose control.;Long Term: Attainment of HbA1C < 7%.    Heart Failure Yes    Intervention Provide a combined exercise and nutrition program that is supplemented with education, support and counseling about heart failure. Directed toward relieving symptoms such as shortness of breath, decreased exercise  tolerance, and extremity edema.    Expected Outcomes Improve functional capacity of life;Short term: Attendance in program 2-3 days a week with increased exercise capacity. Reported lower sodium intake. Reported increased fruit and vegetable intake. Reports medication compliance.;Short term: Daily weights obtained and reported for increase. Utilizing diuretic protocols set by physician.;Long term: Adoption of self-care skills and reduction of barriers for early signs and symptoms recognition  and intervention leading to self-care maintenance.    Hypertension Yes    Intervention Provide education on lifestyle modifcations including regular physical activity/exercise, weight management, moderate sodium restriction and increased consumption of fresh fruit, vegetables, and low fat dairy, alcohol moderation, and smoking cessation.;Monitor prescription use compliance.    Expected Outcomes Short Term: Continued assessment and intervention until BP is < 140/13mm HG in hypertensive participants. < 130/50mm HG in hypertensive participants with diabetes, heart failure or chronic kidney disease.;Long Term: Maintenance of blood pressure at goal levels.             Tobacco Use Initial Evaluation: Social History   Tobacco Use  Smoking Status Never  Smokeless Tobacco Never    Exercise Goals and Review:  Exercise Goals     Row Name 04/05/23 1621             Exercise Goals   Increase Physical Activity Yes       Intervention Develop an individualized exercise prescription for aerobic and resistive training based on initial evaluation findings, risk stratification, comorbidities and participant's personal goals.;Provide advice, education, support and counseling about physical activity/exercise needs.       Expected Outcomes Short Term: Attend rehab on a regular basis to increase amount of physical activity.;Long Term: Exercising regularly at least 3-5 days a week.;Long Term: Add in home exercise to make exercise part of routine and to increase amount of physical activity.       Increase Strength and Stamina Yes       Intervention Develop an individualized exercise prescription for aerobic and resistive training based on initial evaluation findings, risk stratification, comorbidities and participant's personal goals.;Provide advice, education, support and counseling about physical activity/exercise needs.       Expected Outcomes Short Term: Increase workloads from initial exercise  prescription for resistance, speed, and METs.;Long Term: Improve cardiorespiratory fitness, muscular endurance and strength as measured by increased METs and functional capacity ( );Short Term: Perform resistance training exercises routinely during rehab and add in resistance training at home       Able to understand and use rate of perceived exertion (RPE) scale Yes       Intervention Provide education and explanation on how to use RPE scale       Expected Outcomes Short Term: Able to use RPE daily in rehab to express subjective intensity level;Long Term:  Able to use RPE to guide intensity level when exercising independently       Able to understand and use Dyspnea scale Yes       Intervention Provide education and explanation on how to use Dyspnea scale       Expected Outcomes Short Term: Able to use Dyspnea scale daily in rehab to express subjective sense of shortness of breath during exertion;Long Term: Able to use Dyspnea scale to guide intensity level when exercising independently       Knowledge and understanding of Target Heart Rate Range (THRR) Yes       Intervention Provide education and explanation of THRR including how the numbers were predicted and where they are located for  reference       Expected Outcomes Short Term: Able to state/look up THRR;Long Term: Able to use THRR to govern intensity when exercising independently;Short Term: Able to use daily as guideline for intensity in rehab       Able to check pulse independently Yes       Intervention Provide education and demonstration on how to check pulse in carotid and radial arteries.;Review the importance of being able to check your own pulse for safety during independent exercise       Expected Outcomes Short Term: Able to explain why pulse checking is important during independent exercise;Long Term: Able to check pulse independently and accurately       Understanding of Exercise Prescription Yes       Intervention Provide  education, explanation, and written materials on patient's individual exercise prescription       Expected Outcomes Short Term: Able to explain program exercise prescription;Long Term: Able to explain home exercise prescription to exercise independently                Copy of goals given to participant.

## 2023-04-16 ENCOUNTER — Encounter (HOSPITAL_COMMUNITY): Payer: 59

## 2023-04-17 ENCOUNTER — Encounter (HOSPITAL_COMMUNITY): Payer: Self-pay | Admitting: *Deleted

## 2023-04-17 DIAGNOSIS — J9601 Acute respiratory failure with hypoxia: Secondary | ICD-10-CM

## 2023-04-17 DIAGNOSIS — J84116 Cryptogenic organizing pneumonia: Secondary | ICD-10-CM

## 2023-04-17 NOTE — Progress Notes (Signed)
 Pulmonary Individual Treatment Plan  Patient Details  Name: Zachary Henry MRN: [id number] Date of Birth: [birth year] Referring Provider:   Flowsheet Row PULMONARY REHAB OTHER RESP ORIENTATION from 04/05/2023 in [facility] CARDIAC REHABILITATION  Referring Provider wofford, anne MD       Initial Encounter Date:  Flowsheet Row PULMONARY REHAB OTHER RESP ORIENTATION from 04/05/2023 in [facility] CARDIAC REHABILITATION  Date 04/05/23       Visit Diagnosis: Acute respiratory failure with hypoxia (HCC)  Cryptogenic organizing pneumonia (HCC)  Patient's Home Medications on Admission:   Current Outpatient Medications:    acetaminophen (TYLENOL) 325 MG tablet, Take 650 mg by mouth every 4 (four) hours as needed for moderate pain (pain score 4-6)., Disp: , Rfl:    acyclovir (ZOVIRAX) 800 MG tablet, Take 800 mg by mouth 2 (two) times daily., Disp: , Rfl:    albuterol (VENTOLIN HFA) 108 (90 Base) MCG/ACT inhaler, Inhale 2 puffs into the lungs every 4 (four) hours as needed (SOB)., Disp: , Rfl:    chlorhexidine (PERIDEX) 0.12 % solution, Use as directed 5 mLs in the mouth or throat 2 (two) times daily., Disp: , Rfl:    CRESEMBA 186 MG CAPS, Take 2 capsules by mouth daily., Disp: , Rfl:    famotidine (PEPCID) 20 MG tablet, Take 20 mg by mouth daily as needed for heartburn., Disp: , Rfl:    fluticasone-salmeterol (ADVAIR) 250-50 MCG/ACT AEPB, Inhale 1 puff into the lungs in the morning and at bedtime., Disp: , Rfl:    folic acid (FOLVITE) 1 MG tablet, Take 1 mg by mouth daily., Disp: , Rfl:    Insulin Glargine (BASAGLAR KWIKPEN) 100 UNIT/ML, Inject 12 Units into the skin 2 (two) times daily., Disp: , Rfl:    insulin lispro (HUMALOG) 100 UNIT/ML KwikPen, Inject 6 Units into the skin 3 (three) times daily. Sliding scale, Disp: , Rfl:    metoprolol succinate (TOPROL-XL) 25 MG 24 hr tablet, Take 1 tablet by mouth daily., Disp: , Rfl:    Multiple Vitamin (MULTI-VITAMIN) tablet, Take 1 tablet by  mouth daily., Disp: , Rfl:    ondansetron  (ZOFRAN -ODT) 8 MG disintegrating tablet, Take 8 mg by mouth every 8 (eight) hours as needed for nausea., Disp: , Rfl:    oxymetazoline (AFRIN) 0.05 % nasal spray, Place 2 sprays into the nose 2 (two) times daily as needed (Nose bleed)., Disp: , Rfl:    penicillin v potassium (VEETID) 500 MG tablet, Take 500 mg by mouth 2 (two) times daily., Disp: , Rfl:    predniSONE (DELTASONE) 10 MG tablet, Take 80 mg by mouth 2 (two) times daily., Disp: , Rfl:    PREVYMIS 480 MG TABS, Take 1 tablet by mouth daily., Disp: , Rfl:    SODIUM FLUORIDE 5000 PPM 1.1 % GEL dental gel, Place 1 Application onto teeth at bedtime., Disp: , Rfl:    Specialty Vitamins Products (MG PLUS PROTEIN) 133 MG TABS, Take 133 mg by mouth daily., Disp: , Rfl:    sulfamethoxazole-trimethoprim (BACTRIM DS) 800-160 MG tablet, Take 1 tablet by mouth 3 (three) times a week., Disp: , Rfl:    tacrolimus (PROGRAF) 0.5 MG capsule, Take 1 mg by mouth 2 (two) times daily., Disp: , Rfl:   Past Medical History: Past Medical History:  Diagnosis Date   Cancer (HCC)     Tobacco Use: Social History   Tobacco Use  Smoking Status Never  Smokeless Tobacco Never    Labs: Review Flowsheet  No data to display          Capillary Blood Glucose: No results found for: GLUCAP   Pulmonary Assessment Scores:  Pulmonary Assessment Scores     Row Name 04/05/23 1251         ADL UCSD   ADL Phase Entry     SOB Score total 19     Rest 0     Walk 1     Stairs 2     Bath 1     Dress 2     Shop 2       CAT Score   CAT Score 4       mMRC Score   mMRC Score 1             UCSD: Self-administered rating of dyspnea associated with activities of daily living (ADLs) 6-point scale (0 = not at all to 5 = maximal or unable to do because of breathlessness)  Scoring Scores range from 0 to 120.  Minimally important difference is 5 units  CAT: CAT can identify the health impairment  of COPD patients and is better correlated with disease progression.  CAT has a scoring range of zero to 40. The CAT score is classified into four groups of low (less than 10), medium (10 - 20), high (21-30) and very high (31-40) based on the impact level of disease on health status. A CAT score over 10 suggests significant symptoms.  A worsening CAT score could be explained by an exacerbation, poor medication adherence, poor inhaler technique, or progression of COPD or comorbid conditions.  CAT MCID is 2 points  mMRC: mMRC (Modified Medical Research Council) Dyspnea Scale is used to assess the degree of baseline functional disability in patients of respiratory disease due to dyspnea. No minimal important difference is established. A decrease in score of 1 point or greater is considered a positive change.   Pulmonary Function Assessment:   Exercise Target Goals: Exercise Program Goal: Individual exercise prescription set using results from initial 6 min walk test and THRR while considering  patient's activity barriers and safety.   Exercise Prescription Goal: Initial exercise prescription builds to 30-45 minutes a day of aerobic activity, 2-3 days per week.  Home exercise guidelines will be given to patient during program as part of exercise prescription that the participant will acknowledge.  Activity Barriers & Risk Stratification:  Activity Barriers & Cardiac Risk Stratification - 04/05/23 1333       Activity Barriers & Cardiac Risk Stratification   Activity Barriers Shortness of Breath;Deconditioning;Muscular Weakness;Assistive Device;Balance Concerns    Cardiac Risk Stratification Low             6 Minute Walk:  6 Minute Walk     Row Name 04/05/23 1616         6 Minute Walk   Phase Initial     Distance 750 feet     Walk Time 6 minutes     # of Rest Breaks 0     MPH 1.42     METS 1.42     RPE 12     Perceived Dyspnea  2     VO2 Peak 14.36     Symptoms No      Resting HR 115 bpm     Resting BP 116/90     Resting Oxygen Saturation  96 %     Exercise Oxygen Saturation  during 6 min walk 94 %     Max Ex.  HR 154 bpm     Max Ex. BP 140/90     2 Minute Post BP 128/86       Interval HR   1 Minute HR 144     2 Minute HR 148     3 Minute HR 152     4 Minute HR 152     5 Minute HR 148     6 Minute HR 154     2 Minute Post HR 123     Interval Heart Rate? Yes       Interval Oxygen   Interval Oxygen? Yes     Baseline Oxygen Saturation % 96 %     1 Minute Oxygen Saturation % 94 %     1 Minute Liters of Oxygen 0 L     2 Minute Oxygen Saturation % 94 %     2 Minute Liters of Oxygen 0 L     3 Minute Oxygen Saturation % 95 %     3 Minute Liters of Oxygen 0 L     4 Minute Oxygen Saturation % 95 %     4 Minute Liters of Oxygen 0 L     5 Minute Oxygen Saturation % 94 %     5 Minute Liters of Oxygen 0 L     6 Minute Oxygen Saturation % 94 %     6 Minute Liters of Oxygen 0 L     2 Minute Post Oxygen Saturation % 95 %     2 Minute Post Liters of Oxygen 0 L              Oxygen Initial Assessment:  Oxygen Initial Assessment - 04/05/23 1335       Home Oxygen   Home Oxygen Device Home Concentrator;Portable Concentrator    Sleep Oxygen Prescription --   As Needed   Compliance with Home Oxygen Use Yes      Intervention   Short Term Goals To learn and exhibit compliance with exercise, home and travel O2 prescription;To learn and understand importance of monitoring SPO2 with pulse oximeter and demonstrate accurate use of the pulse oximeter.;To learn and understand importance of maintaining oxygen saturations>88%;To learn and demonstrate proper pursed lip breathing techniques or other breathing techniques. ;To learn and demonstrate proper use of respiratory medications    Long  Term Goals Exhibits compliance with exercise, home  and travel O2 prescription;Verbalizes importance of monitoring SPO2 with pulse oximeter and return demonstration;Maintenance  of O2 saturations>88%;Exhibits proper breathing techniques, such as pursed lip breathing or other method taught during program session;Compliance with respiratory medication;Demonstrates proper use of MDI's             Oxygen Re-Evaluation:   Oxygen Discharge (Final Oxygen Re-Evaluation):   Initial Exercise Prescription:  Initial Exercise Prescription - 04/05/23 1600       Date of Initial Exercise RX and Referring Provider   Date 04/05/23    Referring Provider wofford, anne MD      Oxygen   Oxygen Continuous    Maintain Oxygen Saturation 88% or higher      NuStep   Level 1    SPM 50    Minutes 15    METs 1.9      REL-XR   Level 1    Speed 50    Minutes 15    METs 1.8      Prescription Details   Frequency (times per week) 3    Duration Progress to 30  minutes of continuous aerobic without signs/symptoms of physical distress      Intensity   THRR 40-80% of Max Heartrate 143-170    Ratings of Perceived Exertion 11-13    Perceived Dyspnea 0-4             Perform Capillary Blood Glucose checks as needed.  Exercise Prescription Changes:   Exercise Prescription Changes     Row Name 04/05/23 1600             Response to Exercise   Blood Pressure (Admit) 116/90       Blood Pressure (Exercise) 140/90       Blood Pressure (Exit) 128/86       Heart Rate (Admit) 115 bpm       Heart Rate (Exercise) 154 bpm       Heart Rate (Exit) 123 bpm       Oxygen Saturation (Admit) 96 %       Oxygen Saturation (Exercise) 94 %       Oxygen Saturation (Exit) 95 %       Rating of Perceived Exertion (Exercise) 12       Perceived Dyspnea (Exercise) 2         Resistance Training   Weight 3lbs       Reps 10-15                Exercise Comments:   Exercise Goals and Review:   Exercise Goals     Row Name 04/05/23 1621             Exercise Goals   Increase Physical Activity Yes       Intervention Develop an individualized exercise prescription for  aerobic and resistive training based on initial evaluation findings, risk stratification, comorbidities and participant's personal goals.;Provide advice, education, support and counseling about physical activity/exercise needs.       Expected Outcomes Short Term: Attend rehab on a regular basis to increase amount of physical activity.;Long Term: Exercising regularly at least 3-5 days a week.;Long Term: Add in home exercise to make exercise part of routine and to increase amount of physical activity.       Increase Strength and Stamina Yes       Intervention Develop an individualized exercise prescription for aerobic and resistive training based on initial evaluation findings, risk stratification, comorbidities and participant's personal goals.;Provide advice, education, support and counseling about physical activity/exercise needs.       Expected Outcomes Short Term: Increase workloads from initial exercise prescription for resistance, speed, and METs.;Long Term: Improve cardiorespiratory fitness, muscular endurance and strength as measured by increased METs and functional capacity ( );Short Term: Perform resistance training exercises routinely during rehab and add in resistance training at home       Able to understand and use rate of perceived exertion (RPE) scale Yes       Intervention Provide education and explanation on how to use RPE scale       Expected Outcomes Short Term: Able to use RPE daily in rehab to express subjective intensity level;Long Term:  Able to use RPE to guide intensity level when exercising independently       Able to understand and use Dyspnea scale Yes       Intervention Provide education and explanation on how to use Dyspnea scale       Expected Outcomes Short Term: Able to use Dyspnea scale daily in rehab to express subjective sense of shortness of breath during exertion;Long Term: Able  to use Dyspnea scale to guide intensity level when exercising independently        Knowledge and understanding of Target Heart Rate Range (THRR) Yes       Intervention Provide education and explanation of THRR including how the numbers were predicted and where they are located for reference       Expected Outcomes Short Term: Able to state/look up THRR;Long Term: Able to use THRR to govern intensity when exercising independently;Short Term: Able to use daily as guideline for intensity in rehab       Able to check pulse independently Yes       Intervention Provide education and demonstration on how to check pulse in carotid and radial arteries.;Review the importance of being able to check your own pulse for safety during independent exercise       Expected Outcomes Short Term: Able to explain why pulse checking is important during independent exercise;Long Term: Able to check pulse independently and accurately       Understanding of Exercise Prescription Yes       Intervention Provide education, explanation, and written materials on patient's individual exercise prescription       Expected Outcomes Short Term: Able to explain program exercise prescription;Long Term: Able to explain home exercise prescription to exercise independently                Exercise Goals Re-Evaluation :   Discharge Exercise Prescription (Final Exercise Prescription Changes):  Exercise Prescription Changes - 04/05/23 1600       Response to Exercise   Blood Pressure (Admit) 116/90    Blood Pressure (Exercise) 140/90    Blood Pressure (Exit) 128/86    Heart Rate (Admit) 115 bpm    Heart Rate (Exercise) 154 bpm    Heart Rate (Exit) 123 bpm    Oxygen Saturation (Admit) 96 %    Oxygen Saturation (Exercise) 94 %    Oxygen Saturation (Exit) 95 %    Rating of Perceived Exertion (Exercise) 12    Perceived Dyspnea (Exercise) 2      Resistance Training   Weight 3lbs    Reps 10-15             Nutrition:  Target Goals: Understanding of nutrition guidelines, daily intake of sodium 1500mg ,  cholesterol 200mg , calories 30% from fat and 7% or less from saturated fats, daily to have 5 or more servings of fruits and vegetables.  Biometrics:  Pre Biometrics - 04/05/23 1621       Pre Biometrics   Weight 229 lb 4.5 oz (104 kg)              Nutrition Therapy Plan and Nutrition Goals:   Nutrition Assessments:  MEDIFICTS Score Key: >=70 Need to make dietary changes  40-70 Heart Healthy Diet <= 40 Therapeutic Level Cholesterol Diet  Flowsheet Row PULMONARY REHAB OTHER RESP ORIENTATION from 04/05/2023 in [facility] CARDIAC REHABILITATION  Picture Your Plate Total Score on Admission 57      Picture Your Plate Scores: <59 Unhealthy dietary pattern with much room for improvement. 41-50 Dietary pattern unlikely to meet recommendations for good health and room for improvement. 51-60 More healthful dietary pattern, with some room for improvement.  >60 Healthy dietary pattern, although there may be some specific behaviors that could be improved.    Nutrition Goals Re-Evaluation:   Nutrition Goals Discharge (Final Nutrition Goals Re-Evaluation):   Psychosocial: Target Goals: Acknowledge presence or absence of significant depression and/or stress, maximize coping skills,  provide positive support system. Participant is able to verbalize types and ability to use techniques and skills needed for reducing stress and depression.  Initial Review & Psychosocial Screening:  Initial Psych Review & Screening - 04/05/23 1500       Initial Review   Current issues with None Identified      Family Dynamics   Good Support System? Yes    Comments His parents and wife are his support people.      Barriers   Psychosocial barriers to participate in program The patient should benefit from training in stress management and relaxation.;There are no identifiable barriers or psychosocial needs.      Screening Interventions   Interventions Encouraged to exercise;To provide support and  resources with identified psychosocial needs;Provide feedback about the scores to participant    Expected Outcomes Short Term goal: Utilizing psychosocial counselor, staff and physician to assist with identification of specific Stressors or current issues interfering with healing process. Setting desired goal for each stressor or current issue identified.;Long Term Goal: Stressors or current issues are controlled or eliminated.;Short Term goal: Identification and review with participant of any Quality of Life or Depression concerns found by scoring the questionnaire.;Long Term goal: The participant improves quality of Life and PHQ9 Scores as seen by post scores and/or verbalization of changes             Quality of Life Scores:  Scores of 19 and below usually indicate a poorer quality of life in these areas.  A difference of  2-3 points is a clinically meaningful difference.  A difference of 2-3 points in the total score of the Quality of Life Index has been associated with significant improvement in overall quality of life, self-image, physical symptoms, and general health in studies assessing change in quality of life.   PHQ-9: Review Flowsheet       04/05/2023  Depression screen PHQ 2/9  Decreased Interest 0  Down, Depressed, Hopeless 0  PHQ - 2 Score 0  Altered sleeping 3  Tired, decreased energy 1  Change in appetite 0  Feeling bad or failure about yourself  0  Trouble concentrating 0  Moving slowly or fidgety/restless 0  Suicidal thoughts 0  PHQ-9 Score 4  Difficult doing work/chores Not difficult at all   Interpretation of Total Score  Total Score Depression Severity:  1-4 = Minimal depression, 5-9 = Mild depression, 10-14 = Moderate depression, 15-19 = Moderately severe depression, 20-27 = Severe depression   Psychosocial Evaluation and Intervention:  Psychosocial Evaluation - 04/05/23 1501       Psychosocial Evaluation & Interventions   Interventions Stress  management education;Relaxation education;Encouraged to exercise with the program and follow exercise prescription    Comments Patient referred to PR with acute respiratory failure with hypoxia and pneumonia from [facility]. His PHQ-9 score was 4 due to trouble staying asleep and having little energy. Patient denies any depression, anxiety or major stressors in his life. 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 He went into remission after 6 months of chemotherapy and had a relapse in 1/24. He received a bone marrow transplant 6/24 and is in remission. He was doing well until developing pneumonia in November with acute respiratory failure requiring intubation and was hospitalized for 28 days. He requires w/c for long distance due to SOB and lower extremity muscle weakness from being immobile for a month. He had home PT and was discharged a few days ago  but still does the exercises.  He is in good spirits and says he is thankful to be alive. He lives with his wife and they have 2 children. 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 He is very motivated to participate in PR. His goals for the program are to breathe better; improve his overall mobility and balance; and be independent and get back to normal. He has no barriers identified to complete the program.    Expected Outcomes Short Term: Start the program and attend consistently. Long Term: meet his personal goals.    Continue Psychosocial Services  Follow up required by staff             Psychosocial Re-Evaluation:   Psychosocial Discharge (Final Psychosocial Re-Evaluation):    Education: Education Goals: Education classes will be provided on a weekly basis, covering required topics. Participant will state understanding/return demonstration of topics presented.  Learning Barriers/Preferences:  Learning  Barriers/Preferences - 04/05/23 1340       Learning Barriers/Preferences   Learning Barriers None    Learning Preferences Written Material;Audio;Skilled Demonstration             Education Topics: How Lungs Work and Diseases: - Discuss the anatomy of the lungs and diseases that can affect the lungs, such as COPD.   Exercise: -Discuss the importance of exercise, FITT principles of exercise, normal and abnormal responses to exercise, and how to exercise safely.   Environmental Irritants: -Discuss types of environmental irritants and how to limit exposure to environmental irritants.   Meds/Inhalers and oxygen: - Discuss respiratory medications, definition of an inhaler and oxygen, and the proper way to use an inhaler and oxygen.   Energy Saving Techniques: - Discuss methods to conserve energy and decrease shortness of breath when performing activities of daily living.    Bronchial Hygiene / Breathing Techniques: - Discuss breathing mechanics, pursed-lip breathing technique,  proper posture, effective ways to clear airways, and other functional breathing techniques   Cleaning Equipment: - Provides group verbal and written instruction about the health risks of elevated stress, cause of high stress, and healthy ways to reduce stress.   Nutrition I: Fats: - Discuss the types of cholesterol, what cholesterol does to the body, and how cholesterol levels can be controlled.   Nutrition II: Labels: -Discuss the different components of food labels and how to read food labels.   Respiratory Infections: - Discuss the signs and symptoms of respiratory infections, ways to prevent respiratory infections, and the importance of seeking medical treatment when having a respiratory infection.   Stress I: Signs and Symptoms: - Discuss the causes of stress, how stress may lead to anxiety and depression, and ways to limit stress.   Stress II: Relaxation: -Discuss relaxation  techniques to limit stress.   Oxygen for Home/Travel: - Discuss how to prepare for travel when on oxygen and proper ways to transport and store oxygen to ensure safety.   Knowledge Questionnaire Score:  Knowledge Questionnaire Score - 04/05/23 1251       Knowledge Questionnaire Score   Pre Score 16/18             Core Components/Risk Factors/Patient Goals at Admission:  Personal Goals and Risk Factors at Admission - 04/05/23 1454       Core Components/Risk Factors/Patient Goals on Admission    Weight Management Weight Maintenance;Obesity    Improve shortness of breath with ADL's Yes  Intervention Provide education, individualized exercise plan and daily activity instruction to help decrease symptoms of SOB with activities of daily living.    Expected Outcomes Short Term: Improve cardiorespiratory fitness to achieve a reduction of symptoms when performing ADLs;Long Term: Be able to perform more ADLs without symptoms or delay the onset of symptoms    Diabetes Yes    Intervention Provide education about signs/symptoms and action to take for hypo/hyperglycemia.;Provide education about proper nutrition, including hydration, and aerobic/resistive exercise prescription along with prescribed medications to achieve blood glucose in normal ranges: Fasting glucose 65-99 mg/dL    Expected Outcomes Short Term: Participant verbalizes understanding of the signs/symptoms and immediate care of hyper/hypoglycemia, proper foot care and importance of medication, aerobic/resistive exercise and nutrition plan for blood glucose control.;Long Term: Attainment of HbA1C < 7%.    Heart Failure Yes    Intervention Provide a combined exercise and nutrition program that is supplemented with education, support and counseling about heart failure. Directed toward relieving symptoms such as shortness of breath, decreased exercise tolerance, and extremity edema.    Expected Outcomes Improve functional capacity of  life;Short term: Attendance in program 2-3 days a week with increased exercise capacity. Reported lower sodium intake. Reported increased fruit and vegetable intake. Reports medication compliance.;Short term: Daily weights obtained and reported for increase. Utilizing diuretic protocols set by physician.;Long term: Adoption of self-care skills and reduction of barriers for early signs and symptoms recognition and intervention leading to self-care maintenance.    Hypertension Yes    Intervention Provide education on lifestyle modifcations including regular physical activity/exercise, weight management, moderate sodium restriction and increased consumption of fresh fruit, vegetables, and low fat dairy, alcohol moderation, and smoking cessation.;Monitor prescription use compliance.    Expected Outcomes Short Term: Continued assessment and intervention until BP is < 140/50mm HG in hypertensive participants. < 130/30mm HG in hypertensive participants with diabetes, heart failure or chronic kidney disease.;Long Term: Maintenance of blood pressure at goal levels.             Core Components/Risk Factors/Patient Goals Review:    Core Components/Risk Factors/Patient Goals at Discharge (Final Review):    ITP Comments:  ITP Comments     Row Name 04/17/23 0736           ITP Comments 30 day review completed. ITP sent to Dr.Jehanzeb Memon, Medical Director of  Pulmonary Rehab. Continue with ITP unless changes are made by physician.   Only completed orientation thus far.                Comments: 30 day review

## 2023-04-20 ENCOUNTER — Encounter (HOSPITAL_COMMUNITY): Payer: BC Managed Care – PPO

## 2023-04-23 ENCOUNTER — Encounter (HOSPITAL_COMMUNITY): Payer: BC Managed Care – PPO

## 2023-04-25 ENCOUNTER — Encounter (HOSPITAL_COMMUNITY): Payer: BC Managed Care – PPO

## 2023-04-27 ENCOUNTER — Encounter (HOSPITAL_COMMUNITY): Payer: BC Managed Care – PPO

## 2023-04-30 ENCOUNTER — Encounter (HOSPITAL_COMMUNITY): Payer: BC Managed Care – PPO

## 2023-05-02 ENCOUNTER — Encounter (HOSPITAL_COMMUNITY): Payer: BC Managed Care – PPO

## 2023-05-02 ENCOUNTER — Telehealth (HOSPITAL_COMMUNITY): Payer: Self-pay

## 2023-05-04 ENCOUNTER — Encounter (HOSPITAL_COMMUNITY): Payer: BC Managed Care – PPO

## 2023-05-07 ENCOUNTER — Encounter (HOSPITAL_COMMUNITY): Payer: BC Managed Care – PPO

## 2023-05-09 ENCOUNTER — Encounter (HOSPITAL_COMMUNITY): Payer: BC Managed Care – PPO

## 2023-05-11 ENCOUNTER — Encounter (HOSPITAL_COMMUNITY)
Admission: RE | Admit: 2023-05-11 | Discharge: 2023-05-11 | Disposition: A | Discharge status: Home / Self Care | Payer: BC Managed Care – PPO | Source: Ambulatory Visit | Attending: Hematology and Oncology | Admitting: Hematology and Oncology

## 2023-05-11 ENCOUNTER — Telehealth (HOSPITAL_COMMUNITY): Payer: Self-pay

## 2023-05-11 DIAGNOSIS — J9601 Acute respiratory failure with hypoxia: Secondary | ICD-10-CM | POA: Insufficient documentation

## 2023-05-11 DIAGNOSIS — J84116 Cryptogenic organizing pneumonia: Secondary | ICD-10-CM | POA: Insufficient documentation

## 2023-05-11 NOTE — Telephone Encounter (Signed)
Called patient regarding not attending pulmonary rehab. 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 He says his surgeon has told him not to return to rehab at this time. He is supposed to be discharged home with PT soon. He request we wait at least a month to see if he can return to the program. We will hold until March 1 and touch base to see where he is in his recovery. Patient was good with this plan.

## 2023-05-14 ENCOUNTER — Encounter (HOSPITAL_COMMUNITY): Payer: BC Managed Care – PPO

## 2023-05-16 ENCOUNTER — Encounter (HOSPITAL_COMMUNITY): Payer: BC Managed Care – PPO

## 2023-05-16 ENCOUNTER — Encounter (HOSPITAL_COMMUNITY): Payer: Self-pay | Admitting: *Deleted

## 2023-05-16 DIAGNOSIS — J84116 Cryptogenic organizing pneumonia: Secondary | ICD-10-CM

## 2023-05-16 DIAGNOSIS — J9601 Acute respiratory failure with hypoxia: Secondary | ICD-10-CM

## 2023-05-16 NOTE — Progress Notes (Signed)
Pulmonary Individual Treatment Plan  Patient Details  Name: Zachary Henry MRN: [id number] Date of Birth: 12-20-1985 Referring Provider:   Flowsheet Row PULMONARY REHAB OTHER RESP ORIENTATION from 04/05/2023 in [facility] CARDIAC REHABILITATION  Referring Provider wofford, anne MD       Initial Encounter Date:  Flowsheet Row PULMONARY REHAB OTHER RESP ORIENTATION from 04/05/2023 in [facility] CARDIAC REHABILITATION  Date 04/05/23       Visit Diagnosis: Acute respiratory failure with hypoxia (HCC)  Cryptogenic organizing pneumonia (HCC)  Patient's Home Medications on Admission:   Current Outpatient Medications:    acetaminophen (TYLENOL) 325 MG tablet, Take 650 mg by mouth every 4 (four) hours as needed for moderate pain (pain score 4-6)., Disp: , Rfl:    acyclovir (ZOVIRAX) 800 MG tablet, Take 800 mg by mouth 2 (two) times daily., Disp: , Rfl:    albuterol (VENTOLIN HFA) 108 (90 Base) MCG/ACT inhaler, Inhale 2 puffs into the lungs every 4 (four) hours as needed (SOB)., Disp: , Rfl:    chlorhexidine (PERIDEX) 0.12 % solution, Use as directed 5 mLs in the mouth or throat 2 (two) times daily., Disp: , Rfl:    CRESEMBA 186 MG CAPS, Take 2 capsules by mouth daily., Disp: , Rfl:    famotidine (PEPCID) 20 MG tablet, Take 20 mg by mouth daily as needed for heartburn., Disp: , Rfl:    fluticasone-salmeterol (ADVAIR) 250-50 MCG/ACT AEPB, Inhale 1 puff into the lungs in the morning and at bedtime., Disp: , Rfl:    folic acid (FOLVITE) 1 MG tablet, Take 1 mg by mouth daily., Disp: , Rfl:    Insulin Glargine (BASAGLAR KWIKPEN) 100 UNIT/ML, Inject 12 Units into the skin 2 (two) times daily., Disp: , Rfl:    insulin lispro (HUMALOG) 100 UNIT/ML KwikPen, Inject 6 Units into the skin 3 (three) times daily. Sliding scale, Disp: , Rfl:    metoprolol succinate (TOPROL-XL) 25 MG 24 hr tablet, Take 1 tablet by mouth daily., Disp: , Rfl:    Multiple Vitamin (MULTI-VITAMIN) tablet, Take 1 tablet by  mouth daily., Disp: , Rfl:    ondansetron (ZOFRAN-ODT) 8 MG disintegrating tablet, Take 8 mg by mouth every 8 (eight) hours as needed for nausea., Disp: , Rfl:    oxymetazoline (AFRIN) 0.05 % nasal spray, Place 2 sprays into the nose 2 (two) times daily as needed (Nose bleed)., Disp: , Rfl:    penicillin v potassium (VEETID) 500 MG tablet, Take 500 mg by mouth 2 (two) times daily., Disp: , Rfl:    predniSONE (DELTASONE) 10 MG tablet, Take 80 mg by mouth 2 (two) times daily., Disp: , Rfl:    PREVYMIS 480 MG TABS, Take 1 tablet by mouth daily., Disp: , Rfl:    SODIUM FLUORIDE 5000 PPM 1.1 % GEL dental gel, Place 1 Application onto teeth at bedtime., Disp: , Rfl:    Specialty Vitamins Products (MG PLUS PROTEIN) 133 MG TABS, Take 133 mg by mouth daily., Disp: , Rfl:    sulfamethoxazole-trimethoprim (BACTRIM DS) 800-160 MG tablet, Take 1 tablet by mouth 3 (three) times a week., Disp: , Rfl:    tacrolimus (PROGRAF) 0.5 MG capsule, Take 1 mg by mouth 2 (two) times daily., Disp: , Rfl:   Past Medical History: Past Medical History:  Diagnosis Date   Cancer (HCC)     Tobacco Use: Social History   Tobacco Use  Smoking Status Never  Smokeless Tobacco Never    Labs: Review Flowsheet  No data to display          Capillary Blood Glucose: No results found for: "GLUCAP"   Pulmonary Assessment Scores:  Pulmonary Assessment Scores     Row Name 04/05/23 1251         ADL UCSD   ADL Phase Entry     SOB Score total 19     Rest 0     Walk 1     Stairs 2     Bath 1     Dress 2     Shop 2       CAT Score   CAT Score 4       mMRC Score   mMRC Score 1             UCSD: Self-administered rating of dyspnea associated with activities of daily living (ADLs) 6-point scale (0 = "not at all" to 5 = "maximal or unable to do because of breathlessness")  Scoring Scores range from 0 to 120.  Minimally important difference is 5 units  CAT: CAT can identify the health impairment  of COPD patients and is better correlated with disease progression.  CAT has a scoring range of zero to 40. The CAT score is classified into four groups of low (less than 10), medium (10 - 20), high (21-30) and very high (31-40) based on the impact level of disease on health status. A CAT score over 10 suggests significant symptoms.  A worsening CAT score could be explained by an exacerbation, poor medication adherence, poor inhaler technique, or progression of COPD or comorbid conditions.  CAT MCID is 2 points  mMRC: mMRC (Modified Medical Research Council) Dyspnea Scale is used to assess the degree of baseline functional disability in patients of respiratory disease due to dyspnea. No minimal important difference is established. A decrease in score of 1 point or greater is considered a positive change.   Pulmonary Function Assessment:   Exercise Target Goals: Exercise Program Goal: Individual exercise prescription set using results from initial 6 min walk test and THRR while considering  patient's activity barriers and safety.   Exercise Prescription Goal: Initial exercise prescription builds to 30-45 minutes a day of aerobic activity, 2-3 days per week.  Home exercise guidelines will be given to patient during program as part of exercise prescription that the participant will acknowledge.  Activity Barriers & Risk Stratification:  Activity Barriers & Cardiac Risk Stratification - 04/05/23 1333       Activity Barriers & Cardiac Risk Stratification   Activity Barriers Shortness of Breath;Deconditioning;Muscular Weakness;Assistive Device;Balance Concerns    Cardiac Risk Stratification Low             6 Minute Walk:  6 Minute Walk     Row Name 04/05/23 1616         6 Minute Walk   Phase Initial     Distance 750 feet     Walk Time 6 minutes     # of Rest Breaks 0     MPH 1.42     METS 1.42     RPE 12     Perceived Dyspnea  2     VO2 Peak 14.36     Symptoms No      Resting HR 115 bpm     Resting BP 116/90     Resting Oxygen Saturation  96 %     Exercise Oxygen Saturation  during 6 min walk 94 %     Max Ex.  HR 154 bpm     Max Ex. BP 140/90     2 Minute Post BP 128/86       Interval HR   1 Minute HR 144     2 Minute HR 148     3 Minute HR 152     4 Minute HR 152     5 Minute HR 148     6 Minute HR 154     2 Minute Post HR 123     Interval Heart Rate? Yes       Interval Oxygen   Interval Oxygen? Yes     Baseline Oxygen Saturation % 96 %     1 Minute Oxygen Saturation % 94 %     1 Minute Liters of Oxygen 0 L     2 Minute Oxygen Saturation % 94 %     2 Minute Liters of Oxygen 0 L     3 Minute Oxygen Saturation % 95 %     3 Minute Liters of Oxygen 0 L     4 Minute Oxygen Saturation % 95 %     4 Minute Liters of Oxygen 0 L     5 Minute Oxygen Saturation % 94 %     5 Minute Liters of Oxygen 0 L     6 Minute Oxygen Saturation % 94 %     6 Minute Liters of Oxygen 0 L     2 Minute Post Oxygen Saturation % 95 %     2 Minute Post Liters of Oxygen 0 L              Oxygen Initial Assessment:  Oxygen Initial Assessment - 04/05/23 1335       Home Oxygen   Home Oxygen Device Home Concentrator;Portable Concentrator    Sleep Oxygen Prescription --   As Needed   Compliance with Home Oxygen Use Yes      Intervention   Short Term Goals To learn and exhibit compliance with exercise, home and travel O2 prescription;To learn and understand importance of monitoring SPO2 with pulse oximeter and demonstrate accurate use of the pulse oximeter.;To learn and understand importance of maintaining oxygen saturations>88%;To learn and demonstrate proper pursed lip breathing techniques or other breathing techniques. ;To learn and demonstrate proper use of respiratory medications    Long  Term Goals Exhibits compliance with exercise, home  and travel O2 prescription;Verbalizes importance of monitoring SPO2 with pulse oximeter and return demonstration;Maintenance  of O2 saturations>88%;Exhibits proper breathing techniques, such as pursed lip breathing or other method taught during program session;Compliance with respiratory medication;Demonstrates proper use of MDI's             Oxygen Re-Evaluation:   Oxygen Discharge (Final Oxygen Re-Evaluation):   Initial Exercise Prescription:  Initial Exercise Prescription - 04/05/23 1600       Date of Initial Exercise RX and Referring Provider   Date 04/05/23    Referring Provider wofford, anne MD      Oxygen   Oxygen Continuous    Maintain Oxygen Saturation 88% or higher      NuStep   Level 1    SPM 50    Minutes 15    METs 1.9      REL-XR   Level 1    Speed 50    Minutes 15    METs 1.8      Prescription Details   Frequency (times per week) 3    Duration Progress to 30  minutes of continuous aerobic without signs/symptoms of physical distress      Intensity   THRR 40-80% of Max Heartrate 143-170    Ratings of Perceived Exertion 11-13    Perceived Dyspnea 0-4             Perform Capillary Blood Glucose checks as needed.  Exercise Prescription Changes:   Exercise Prescription Changes     Row Name 04/05/23 1600             Response to Exercise   Blood Pressure (Admit) 116/90       Blood Pressure (Exercise) 140/90       Blood Pressure (Exit) 128/86       Heart Rate (Admit) 115 bpm       Heart Rate (Exercise) 154 bpm       Heart Rate (Exit) 123 bpm       Oxygen Saturation (Admit) 96 %       Oxygen Saturation (Exercise) 94 %       Oxygen Saturation (Exit) 95 %       Rating of Perceived Exertion (Exercise) 12       Perceived Dyspnea (Exercise) 2         Resistance Training   Weight 3lbs       Reps 10-15                Exercise Comments:   Exercise Goals and Review:   Exercise Goals     Row Name 04/05/23 1621             Exercise Goals   Increase Physical Activity Yes       Intervention Develop an individualized exercise prescription for  aerobic and resistive training based on initial evaluation findings, risk stratification, comorbidities and participant's personal goals.;Provide advice, education, support and counseling about physical activity/exercise needs.       Expected Outcomes Short Term: Attend rehab on a regular basis to increase amount of physical activity.;Long Term: Exercising regularly at least 3-5 days a week.;Long Term: Add in home exercise to make exercise part of routine and to increase amount of physical activity.       Increase Strength and Stamina Yes       Intervention Develop an individualized exercise prescription for aerobic and resistive training based on initial evaluation findings, risk stratification, comorbidities and participant's personal goals.;Provide advice, education, support and counseling about physical activity/exercise needs.       Expected Outcomes Short Term: Increase workloads from initial exercise prescription for resistance, speed, and METs.;Long Term: Improve cardiorespiratory fitness, muscular endurance and strength as measured by increased METs and functional capacity ( );Short Term: Perform resistance training exercises routinely during rehab and add in resistance training at home       Able to understand and use rate of perceived exertion (RPE) scale Yes       Intervention Provide education and explanation on how to use RPE scale       Expected Outcomes Short Term: Able to use RPE daily in rehab to express subjective intensity level;Long Term:  Able to use RPE to guide intensity level when exercising independently       Able to understand and use Dyspnea scale Yes       Intervention Provide education and explanation on how to use Dyspnea scale       Expected Outcomes Short Term: Able to use Dyspnea scale daily in rehab to express subjective sense of shortness of breath during exertion;Long Term: Able  to use Dyspnea scale to guide intensity level when exercising independently        Knowledge and understanding of Target Heart Rate Range (THRR) Yes       Intervention Provide education and explanation of THRR including how the numbers were predicted and where they are located for reference       Expected Outcomes Short Term: Able to state/look up THRR;Long Term: Able to use THRR to govern intensity when exercising independently;Short Term: Able to use daily as guideline for intensity in rehab       Able to check pulse independently Yes       Intervention Provide education and demonstration on how to check pulse in carotid and radial arteries.;Review the importance of being able to check your own pulse for safety during independent exercise       Expected Outcomes Short Term: Able to explain why pulse checking is important during independent exercise;Long Term: Able to check pulse independently and accurately       Understanding of Exercise Prescription Yes       Intervention Provide education, explanation, and written materials on patient's individual exercise prescription       Expected Outcomes Short Term: Able to explain program exercise prescription;Long Term: Able to explain home exercise prescription to exercise independently                Exercise Goals Re-Evaluation :   Discharge Exercise Prescription (Final Exercise Prescription Changes):  Exercise Prescription Changes - 04/05/23 1600       Response to Exercise   Blood Pressure (Admit) 116/90    Blood Pressure (Exercise) 140/90    Blood Pressure (Exit) 128/86    Heart Rate (Admit) 115 bpm    Heart Rate (Exercise) 154 bpm    Heart Rate (Exit) 123 bpm    Oxygen Saturation (Admit) 96 %    Oxygen Saturation (Exercise) 94 %    Oxygen Saturation (Exit) 95 %    Rating of Perceived Exertion (Exercise) 12    Perceived Dyspnea (Exercise) 2      Resistance Training   Weight 3lbs    Reps 10-15             Nutrition:  Target Goals: Understanding of nutrition guidelines, daily intake of sodium 1500mg ,  cholesterol 200mg , calories 30% from fat and 7% or less from saturated fats, daily to have 5 or more servings of fruits and vegetables.  Biometrics:  Pre Biometrics - 04/05/23 1621       Pre Biometrics   Weight 229 lb 4.5 oz (104 kg)              Nutrition Therapy Plan and Nutrition Goals:   Nutrition Assessments:  MEDIFICTS Score Key: >=70 Need to make dietary changes  40-70 Heart Healthy Diet <= 40 Therapeutic Level Cholesterol Diet  Flowsheet Row PULMONARY REHAB OTHER RESP ORIENTATION from 04/05/2023 in [facility] CARDIAC REHABILITATION  Picture Your Plate Total Score on Admission 57      Picture Your Plate Scores: <16 Unhealthy dietary pattern with much room for improvement. 41-50 Dietary pattern unlikely to meet recommendations for good health and room for improvement. 51-60 More healthful dietary pattern, with some room for improvement.  >60 Healthy dietary pattern, although there may be some specific behaviors that could be improved.    Nutrition Goals Re-Evaluation:   Nutrition Goals Discharge (Final Nutrition Goals Re-Evaluation):   Psychosocial: Target Goals: Acknowledge presence or absence of significant depression and/or stress, maximize coping skills,  provide positive support system. Participant is able to verbalize types and ability to use techniques and skills needed for reducing stress and depression.  Initial Review & Psychosocial Screening:  Initial Psych Review & Screening - 04/05/23 1500       Initial Review   Current issues with None Identified      Family Dynamics   Good Support System? Yes    Comments His parents and wife are his support people.      Barriers   Psychosocial barriers to participate in program The patient should benefit from training in stress management and relaxation.;There are no identifiable barriers or psychosocial needs.      Screening Interventions   Interventions Encouraged to exercise;To provide support and  resources with identified psychosocial needs;Provide feedback about the scores to participant    Expected Outcomes Short Term goal: Utilizing psychosocial counselor, staff and physician to assist with identification of specific Stressors or current issues interfering with healing process. Setting desired goal for each stressor or current issue identified.;Long Term Goal: Stressors or current issues are controlled or eliminated.;Short Term goal: Identification and review with participant of any Quality of Life or Depression concerns found by scoring the questionnaire.;Long Term goal: The participant improves quality of Life and PHQ9 Scores as seen by post scores and/or verbalization of changes             Quality of Life Scores:  Scores of 19 and below usually indicate a poorer quality of life in these areas.  A difference of  2-3 points is a clinically meaningful difference.  A difference of 2-3 points in the total score of the Quality of Life Index has been associated with significant improvement in overall quality of life, self-image, physical symptoms, and general health in studies assessing change in quality of life.   PHQ-9: Review Flowsheet       04/05/2023  Depression screen PHQ 2/9  Decreased Interest 0  Down, Depressed, Hopeless 0  PHQ - 2 Score 0  Altered sleeping 3  Tired, decreased energy 1  Change in appetite 0  Feeling bad or failure about yourself  0  Trouble concentrating 0  Moving slowly or fidgety/restless 0  Suicidal thoughts 0  PHQ-9 Score 4  Difficult doing work/chores Not difficult at all   Interpretation of Total Score  Total Score Depression Severity:  1-4 = Minimal depression, 5-9 = Mild depression, 10-14 = Moderate depression, 15-19 = Moderately severe depression, 20-27 = Severe depression   Psychosocial Evaluation and Intervention:  Psychosocial Evaluation - 04/05/23 1501       Psychosocial Evaluation & Interventions   Interventions Stress  management education;Relaxation education;Encouraged to exercise with the program and follow exercise prescription    Comments Patient referred to PR with acute respiratory failure with hypoxia and pneumonia from [facility]. His PHQ-9 score was 4 due to trouble staying asleep and having little energy. Patient denies any depression, anxiety or major stressors in his life. 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 He went into remission after 6 months of chemotherapy and had a relapse in 1/24. He received a bone marrow transplant 6/24 and is in remission. He was doing well until developing pneumonia in November with acute respiratory failure requiring intubation and was hospitalized for 28 days. He requires w/c for long distance due to SOB and lower extremity muscle weakness from being immobile for a month. He had home PT and was discharged a few days ago  but still does the exercises.  He is in good spirits and says he is thankful to be alive. He lives with his wife and they have 2 children. 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 He is very motivated to participate in PR. His goals for the program are to breathe better; improve his overall mobility and balance; and be independent and get back to normal. He has no barriers identified to complete the program.    Expected Outcomes Short Term: Start the program and attend consistently. Long Term: meet his personal goals.    Continue Psychosocial Services  Follow up required by staff             Psychosocial Re-Evaluation:   Psychosocial Discharge (Final Psychosocial Re-Evaluation):    Education: Education Goals: Education classes will be provided on a weekly basis, covering required topics. Participant will state understanding/return demonstration of topics presented.  Learning Barriers/Preferences:  Learning  Barriers/Preferences - 04/05/23 1340       Learning Barriers/Preferences   Learning Barriers None    Learning Preferences Written Material;Audio;Skilled Demonstration             Education Topics: How Lungs Work and Diseases: - Discuss the anatomy of the lungs and diseases that can affect the lungs, such as COPD.   Exercise: -Discuss the importance of exercise, FITT principles of exercise, normal and abnormal responses to exercise, and how to exercise safely.   Environmental Irritants: -Discuss types of environmental irritants and how to limit exposure to environmental irritants.   Meds/Inhalers and oxygen: - Discuss respiratory medications, definition of an inhaler and oxygen, and the proper way to use an inhaler and oxygen.   Energy Saving Techniques: - Discuss methods to conserve energy and decrease shortness of breath when performing activities of daily living.    Bronchial Hygiene / Breathing Techniques: - Discuss breathing mechanics, pursed-lip breathing technique,  proper posture, effective ways to clear airways, and other functional breathing techniques   Cleaning Equipment: - Provides group verbal and written instruction about the health risks of elevated stress, cause of high stress, and healthy ways to reduce stress.   Nutrition I: Fats: - Discuss the types of cholesterol, what cholesterol does to the body, and how cholesterol levels can be controlled.   Nutrition II: Labels: -Discuss the different components of food labels and how to read food labels.   Respiratory Infections: - Discuss the signs and symptoms of respiratory infections, ways to prevent respiratory infections, and the importance of seeking medical treatment when having a respiratory infection.   Stress I: Signs and Symptoms: - Discuss the causes of stress, how stress may lead to anxiety and depression, and ways to limit stress.   Stress II: Relaxation: -Discuss relaxation  techniques to limit stress.   Oxygen for Home/Travel: - Discuss how to prepare for travel when on oxygen and proper ways to transport and store oxygen to ensure safety.   Knowledge Questionnaire Score:  Knowledge Questionnaire Score - 04/05/23 1251       Knowledge Questionnaire Score   Pre Score 16/18             Core Components/Risk Factors/Patient Goals at Admission:  Personal Goals and Risk Factors at Admission - 04/05/23 1454       Core Components/Risk Factors/Patient Goals on Admission    Weight Management Weight Maintenance;Obesity    Improve shortness of breath with ADL's Yes  Intervention Provide education, individualized exercise plan and daily activity instruction to help decrease symptoms of SOB with activities of daily living.    Expected Outcomes Short Term: Improve cardiorespiratory fitness to achieve a reduction of symptoms when performing ADLs;Long Term: Be able to perform more ADLs without symptoms or delay the onset of symptoms    Diabetes Yes    Intervention Provide education about signs/symptoms and action to take for hypo/hyperglycemia.;Provide education about proper nutrition, including hydration, and aerobic/resistive exercise prescription along with prescribed medications to achieve blood glucose in normal ranges: Fasting glucose 65-99 mg/dL    Expected Outcomes Short Term: Participant verbalizes understanding of the signs/symptoms and immediate care of hyper/hypoglycemia, proper foot care and importance of medication, aerobic/resistive exercise and nutrition plan for blood glucose control.;Long Term: Attainment of HbA1C < 7%.    Heart Failure Yes    Intervention Provide a combined exercise and nutrition program that is supplemented with education, support and counseling about heart failure. Directed toward relieving symptoms such as shortness of breath, decreased exercise tolerance, and extremity edema.    Expected Outcomes Improve functional capacity of  life;Short term: Attendance in program 2-3 days a week with increased exercise capacity. Reported lower sodium intake. Reported increased fruit and vegetable intake. Reports medication compliance.;Short term: Daily weights obtained and reported for increase. Utilizing diuretic protocols set by physician.;Long term: Adoption of self-care skills and reduction of barriers for early signs and symptoms recognition and intervention leading to self-care maintenance.    Hypertension Yes    Intervention Provide education on lifestyle modifcations including regular physical activity/exercise, weight management, moderate sodium restriction and increased consumption of fresh fruit, vegetables, and low fat dairy, alcohol moderation, and smoking cessation.;Monitor prescription use compliance.    Expected Outcomes Short Term: Continued assessment and intervention until BP is < 140/14mm HG in hypertensive participants. < 130/63mm HG in hypertensive participants with diabetes, heart failure or chronic kidney disease.;Long Term: Maintenance of blood pressure at goal levels.             Core Components/Risk Factors/Patient Goals Review:    Core Components/Risk Factors/Patient Goals at Discharge (Final Review):    ITP Comments:  ITP Comments     Row Name 04/17/23 0736 05/11/23 1454 05/16/23 0830       ITP Comments 30 day review completed. ITP sent to Dr.Jehanzeb Memon, Medical Director of  Pulmonary Rehab. Continue with ITP unless changes are made by physician.   Only completed orientation thus far. Called patient regarding not attending pulmonary rehab. 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 He says his surgeon has told him not to return to rehab at this time. He is supposed to be discharged home with PT. He request wait at least a month to see if he can return to the program. We will hold until March 1 and touch base to see where he is in his recovery. Patient was good with this plan. 30  day review completed. ITP sent to Dr.Jehanzeb Memon, Medical Director of  Pulmonary Rehab. Continue with ITP unless changes are made by physician. Pt currently out on medical hold, has only completed orientation thus far.              Comments: 30 day review

## 2023-05-18 ENCOUNTER — Encounter (HOSPITAL_COMMUNITY): Payer: BC Managed Care – PPO

## 2023-05-21 ENCOUNTER — Encounter (HOSPITAL_COMMUNITY): Payer: BC Managed Care – PPO

## 2023-05-23 ENCOUNTER — Encounter (HOSPITAL_COMMUNITY): Payer: BC Managed Care – PPO

## 2023-05-25 ENCOUNTER — Encounter (HOSPITAL_COMMUNITY): Payer: BC Managed Care – PPO

## 2023-05-28 ENCOUNTER — Encounter (HOSPITAL_COMMUNITY): Payer: BC Managed Care – PPO

## 2023-05-29 ENCOUNTER — Ambulatory Visit: Payer: BC Managed Care – PPO | Admitting: Physical Therapy

## 2023-05-29 ENCOUNTER — Other Ambulatory Visit: Payer: Self-pay

## 2023-05-29 VITALS — BP 94/74 | HR 104

## 2023-05-29 DIAGNOSIS — R269 Unspecified abnormalities of gait and mobility: Secondary | ICD-10-CM | POA: Insufficient documentation

## 2023-05-29 DIAGNOSIS — M6281 Muscle weakness (generalized): Secondary | ICD-10-CM | POA: Insufficient documentation

## 2023-05-29 DIAGNOSIS — M21371 Foot drop, right foot: Secondary | ICD-10-CM | POA: Diagnosis present

## 2023-05-29 DIAGNOSIS — R2681 Unsteadiness on feet: Secondary | ICD-10-CM | POA: Insufficient documentation

## 2023-05-29 NOTE — Therapy (Signed)
OUTPATIENT PHYSICAL THERAPY NEURO EVALUATION   Patient Name: Zachary Henry MRN: [id number] DOB:September 30, 1985, 38 y.o., male Today's Date: 05/29/2023   PCP: Sharlet Salina Man, PCP [facility]) REFERRING PROVIDER: Maia Plan, MD  END OF SESSION:  PT End of Session - 05/29/23 1236     Visit Number 1    Number of Visits 13    Date for PT Re-Evaluation 07/24/23    Authorization Type Blue Cross Blue Shield    PT Start Time 1232    PT Stop Time 1315    PT Time Calculation (min) 43 min    Equipment Utilized During Treatment Gait belt    Activity Tolerance Patient tolerated treatment well    Behavior During Therapy WFL for tasks assessed/performed             Past Medical History:  Diagnosis Date   Cancer (HCC)    No past surgical history on file. There are no active problems to display for this patient.   ONSET DATE: 05/24/2023 (referral date)  REFERRING DIAG: M47.14 (ICD-10-CM) - Other spondylosis with myelopathy, thoracic region Z98.1 (ICD-10-CM) - S/P spinal fusion Z74.09 (ICD-10-CM) - Impaired mobility  THERAPY DIAG:  Abnormality of gait and mobility  Unsteadiness on feet  Foot drop, right  Muscle weakness (generalized)  Rationale for Evaluation and Treatment: Rehabilitation  SUBJECTIVE:                                                                                                                                                                                             SUBJECTIVE STATEMENT: Per patient and spouse:   Patient was diagnosed with AML in 2022. Patient was clear in 2023 and relapsed in 2024 and had a bone marrow transplant June 2024. Patient had pneumonia in November in 2024 and was put in a vent and hospitalized for ~28 days, resulting in significant funcitonal decline from not using AD to using 2WW. Patient did home therapy after this. Around Christmas, patient was not able to pick R leg off of the ground and started losing  feeling all the way up to his belly button. Patient was found to have a fatty cap due to steroids he was on, resulting in thoracic myelopathy. Patient had surgery to resolve the myelopathy followed by inpatient rehab Dec 28 - Feb 5th. Patient was walking without AD before November, patient was using walker from November on with some rollator use, in rehab mix of manual chair for transport and 2WW for other use. Patient is wanting to get rid of walker if at all possible. Patient reports that his R leg continues to be somewhat  floppy and buckle on R side; he is wearing a PRAFO ~every other night. Patient reports that he has kept up with his exercises from rehab and walks about 1x a day and is only using manual chair at the hospital and otherwise is using 2WW. Patient is going every Monday for blood checks as his hemoglobin runs low, typically around 10-10.5. Patient precautions: 10lb lifting and BLT.   Pt accompanied by: self and Hayley - wife and Alan Ripper - daugther  PERTINENT HISTORY: acute myeloid leukemia (AML - diagnosed 2022 in remission), tachycardia, s/p bone marrow transplant, T6-7 thoracic myelopathy s/p thoracic discectomy, laminectomy, and fusion   PAIN:  Are you having pain? No - sometimes has pain in back   PRECAUTIONS: Back and Other: cannot lift over 10 lbs , hemoglobin trends low around 10, do not see if below 8, BP low on eval  RED FLAGS: None   WEIGHT BEARING RESTRICTIONS: Yes 10 LBS   FALLS: Has patient fallen in last 6 months? Yes. Number of falls 2 falls - one in bathroom and one with legs buckling, reports needing help to get up    LIVING ENVIRONMENT: Lives with: lives with their family - spouse and 2 children Lives in: House/apartment Stairs:  13 stairs indoors and rails on the right, ramp entrance to get into house Has following equipment at home: Quad cane small base, Environmental consultant - 2 wheeled, Environmental consultant - 4 wheeled, Wheelchair (manual), Shower bench, bed side commode, and Grab  bars  PLOF: Independent October 2024, modified independent since then, not working - worked as Conservator, museum/gallery for [employer]  PATIENT GOALS: "Get off the walker."   OBJECTIVE:  Note: Objective measures were completed at Evaluation unless otherwise noted.  DIAGNOSTIC FINDINGS: No imaging results available in [facility] system  COGNITION: Overall cognitive status: Within functional limits for tasks assessed - reports occasional minor forgetfulness    SENSATION: Reports intermittent numbness in feet and below hips, reports diminished sensation from waist down   COORDINATION: Grossly WFL when tested in seated, limited by LE weakness  EDEMA:  None   MUSCLE TONE: mild spasticity RLE > LLE; continue to monitor  POSTURE: rounded shoulders and forward head  LOWER EXTREMITY ROM:     Grossly WFL with exception of R dorsiflexion limited to ~5 degrees of AROM, weak and fatigues quickly  LOWER EXTREMITY MMT:    MMT Right Eval Left Eval  Hip flexion 3+/5 4-/5  Hip extension    Hip abduction 4-/5 4-/5  Hip adduction 4-/5 4-/5  Hip internal rotation    Hip external rotation    Knee flexion 4-/5 4-/5  Knee extension 4-/5 4-/5  Ankle dorsiflexion 3-/5 4-/5  Ankle plantarflexion    Ankle inversion    Ankle eversion    (Blank rows = not tested)  BED MOBILITY:  Reports some challenges in the morning due to pain but able to complete   TRANSFERS: Assistive device utilized: Environmental consultant - 4 wheeled  Sit to stand: SBA Stand to sit: SBA Chair to chair: SBA Floor: Not assessed on eval - per patient and caregiver can due a bump scoot transfer from inpatient rehab  GAIT: Gait pattern: step through pattern, decreased step length- Right, decreased step length- Left, decreased hip/knee flexion- Right, decreased hip/knee flexion- Left, decreased ankle dorsiflexion- Right, trunk flexed, and poor foot clearance- Right Distance walked: various clinic distances Assistive device utilized: Walker - 2  wheeled Level of assistance: SBA Comments: slow, fatigues easily  FUNCTIONAL TESTS:  Held testing  due to lower than typical BP readings and near orthostatic   PATIENT SURVEYS:  Fatigue Severity Scale: 2.9   Vitals:   05/29/23 1240 05/29/23 1242  BP: 101/81 94/74  Pulse: 98 (!) 104    Seated       Standing                                                                                                            TREATMENT DATE:    Self Care: Discussed vital safety include BP readings as patient near orthostatic, these BP readings lower than patient normal so recommend bringing up at next medical visit on Monday, discussed hemoglobin levels and cutoff scores for PT, discussed foot drop and possible trial of AFO in future session which patient was agreeable to, discussed possible trial of compression socks if BP consistently low   PATIENT EDUCATION: Education details: See above, POC, goal collaboration, examination findings  Person educated: Patient and Spouse Education method: Explanation Education comprehension: verbalized understanding and needs further education  HOME EXERCISE PROGRAM: To be provided   GOALS: Goals reviewed with patient? Yes  SHORT TERM GOALS: Target date: 06/19/2023  Patient will demonstrate independence with initial HEP to continue to progress between physical therapy sessions.   Baseline: To be provided  Goal status: INITIAL  2.  TUG to be assessed / STG written Baseline:  Goal status: INITIAL  3.  5xSTS to be assessed / STG written Baseline:  Goal status: INITIAL  4.  Gait speed to be assessed / STG written Baseline: To be assessed  Goal status: INITIAL  5.  .Berg to be assessed / LTG written Baseline: To be assessed  Goal status: INITIAL   LONG TERM GOALS: Target date: 07/24/2023  Patient will report demonstrate independence with final HEP in order to maintain current gains and continue to progress after physical therapy discharge.    Baseline: To be provided  Goal status: INITIAL  2.  TUG to be assessed / LTG written Baseline:  Goal status: INITIAL  3.  5xSTS to be assessed / LTG written Baseline:  Goal status: INITIAL  4.  Gait speed to be assessed / LTG written Baseline: To be assessed  Goal status: INITIAL  5.  .Berg to be assessed / LTG written Baseline: To be assessed  Goal status: INITIAL  ASSESSMENT:  CLINICAL IMPRESSION: Patient is a 38 y.o. male who was seen today for physical therapy evaluation and treatment for impairments in gait s/p T6-7 thoracic myelopathy s/p thoracic discectomy, laminectomy, and fusion with PMH of tachycardia and AML in remission. Patient presenting with lower than normal BP today and near orthostatic readings for diastolic so held functional testing in today's session but to be assessed further in future sessions. Patient with BLT and 10lb lifting precautions. Patient indicates falls risk by reliance on 2WW at this time. Patient also indicates decreased foot clearance on R side and will likely benefit from trial of AFO on this side. Patient presents with abnormal gait, decreased balance, decreased R ankle ROM, weakness, and  abnormal tone/sensation. Patient will benefit from skilled PT to address deficits and progress towards LTGs.   OBJECTIVE IMPAIRMENTS: Abnormal gait, decreased balance, decreased endurance, difficulty walking, decreased ROM, decreased strength, decreased safety awareness, impaired sensation, impaired tone, and pain.   ACTIVITY LIMITATIONS: carrying, bending, standing, transfers, reach over head, and locomotion level  PARTICIPATION LIMITATIONS: meal prep, cleaning, community activity, occupation, and yard work  PERSONAL FACTORS: Age, Time since onset of injury/illness/exacerbation, and 3+ comorbidities: see above  are also affecting patient's functional outcome.   REHAB POTENTIAL: Good  CLINICAL DECISION MAKING: Evolving/moderate complexity  EVALUATION  COMPLEXITY: Moderate  PLAN:  PT FREQUENCY: 2x/week  PT DURATION: 6 weeks  PLANNED INTERVENTIONS: 97164- PT Re-evaluation, 97110-Therapeutic exercises, 97530- Therapeutic activity, 97112- Neuromuscular re-education, 97535- Self Care, 86578- Manual therapy, [phone]- Gait training, Patient/Family education, and Balance training  PLAN FOR NEXT SESSION: assess BERG, 5xSTS, gait speed, and TUG, what is hemoglobin and what is blood pressure at, issue HEP for strength work, trial AFO on RLE, work towards [employer] as able   BLT and 10lb lifting precautions   Carmelia Bake, PT, DPT 05/29/2023, 3:39 PM

## 2023-05-30 ENCOUNTER — Encounter (HOSPITAL_COMMUNITY): Payer: BC Managed Care – PPO

## 2023-06-01 ENCOUNTER — Encounter (HOSPITAL_COMMUNITY): Payer: BC Managed Care – PPO

## 2023-06-04 ENCOUNTER — Encounter (HOSPITAL_COMMUNITY): Payer: BC Managed Care – PPO

## 2023-06-05 ENCOUNTER — Ambulatory Visit: Payer: BC Managed Care – PPO

## 2023-06-06 ENCOUNTER — Ambulatory Visit: Payer: BC Managed Care – PPO

## 2023-06-06 ENCOUNTER — Encounter (HOSPITAL_COMMUNITY): Payer: BC Managed Care – PPO

## 2023-06-07 ENCOUNTER — Telehealth (HOSPITAL_COMMUNITY): Payer: Self-pay

## 2023-06-08 ENCOUNTER — Ambulatory Visit: Payer: BC Managed Care – PPO

## 2023-06-08 ENCOUNTER — Encounter (HOSPITAL_COMMUNITY): Payer: BC Managed Care – PPO

## 2023-06-11 ENCOUNTER — Encounter (HOSPITAL_COMMUNITY): Payer: BC Managed Care – PPO

## 2023-06-12 ENCOUNTER — Ambulatory Visit: Payer: BC Managed Care – PPO

## 2023-06-13 ENCOUNTER — Encounter (HOSPITAL_COMMUNITY): Payer: BC Managed Care – PPO

## 2023-06-13 ENCOUNTER — Encounter (HOSPITAL_COMMUNITY): Payer: Self-pay | Admitting: *Deleted

## 2023-06-13 DIAGNOSIS — J9601 Acute respiratory failure with hypoxia: Secondary | ICD-10-CM

## 2023-06-13 DIAGNOSIS — J84116 Cryptogenic organizing pneumonia: Secondary | ICD-10-CM

## 2023-06-13 NOTE — Progress Notes (Signed)
 Pulmonary Individual Treatment Plan  Patient Details  Name: Zachary Henry MRN: [id number] Date of Birth: 14-Jan-1986 Referring Provider:   Flowsheet Row PULMONARY REHAB OTHER RESP ORIENTATION from 04/05/2023 in [facility] CARDIAC REHABILITATION  Referring Provider wofford, anne MD       Initial Encounter Date:  Flowsheet Row PULMONARY REHAB OTHER RESP ORIENTATION from 04/05/2023 in [facility] CARDIAC REHABILITATION  Date 04/05/23       Visit Diagnosis: Acute respiratory failure with hypoxia (HCC)  Cryptogenic organizing pneumonia (HCC)  Patient's Home Medications on Admission:   Current Outpatient Medications:    acetaminophen (TYLENOL) 325 MG tablet, Take 650 mg by mouth every 4 (four) hours as needed for moderate pain (pain score 4-6)., Disp: , Rfl:    acyclovir (ZOVIRAX) 800 MG tablet, Take 800 mg by mouth 2 (two) times daily., Disp: , Rfl:    albuterol (VENTOLIN HFA) 108 (90 Base) MCG/ACT inhaler, Inhale 2 puffs into the lungs every 4 (four) hours as needed (SOB)., Disp: , Rfl:    chlorhexidine (PERIDEX) 0.12 % solution, Use as directed 5 mLs in the mouth or throat 2 (two) times daily., Disp: , Rfl:    CRESEMBA 186 MG CAPS, Take 2 capsules by mouth daily., Disp: , Rfl:    famotidine (PEPCID) 20 MG tablet, Take 20 mg by mouth daily as needed for heartburn., Disp: , Rfl:    fluticasone-salmeterol (ADVAIR) 250-50 MCG/ACT AEPB, Inhale 1 puff into the lungs in the morning and at bedtime., Disp: , Rfl:    folic acid (FOLVITE) 1 MG tablet, Take 1 mg by mouth daily., Disp: , Rfl:    Insulin Glargine (BASAGLAR KWIKPEN) 100 UNIT/ML, Inject 12 Units into the skin 2 (two) times daily., Disp: , Rfl:    insulin lispro (HUMALOG) 100 UNIT/ML KwikPen, Inject 6 Units into the skin 3 (three) times daily. Sliding scale, Disp: , Rfl:    metoprolol succinate (TOPROL-XL) 25 MG 24 hr tablet, Take 1 tablet by mouth daily., Disp: , Rfl:    Multiple Vitamin (MULTI-VITAMIN) tablet, Take 1 tablet by  mouth daily., Disp: , Rfl:    ondansetron (ZOFRAN-ODT) 8 MG disintegrating tablet, Take 8 mg by mouth every 8 (eight) hours as needed for nausea., Disp: , Rfl:    oxymetazoline (AFRIN) 0.05 % nasal spray, Place 2 sprays into the nose 2 (two) times daily as needed (Nose bleed)., Disp: , Rfl:    penicillin v potassium (VEETID) 500 MG tablet, Take 500 mg by mouth 2 (two) times daily., Disp: , Rfl:    predniSONE (DELTASONE) 10 MG tablet, Take 80 mg by mouth 2 (two) times daily., Disp: , Rfl:    PREVYMIS 480 MG TABS, Take 1 tablet by mouth daily., Disp: , Rfl:    SODIUM FLUORIDE 5000 PPM 1.1 % GEL dental gel, Place 1 Application onto teeth at bedtime., Disp: , Rfl:    Specialty Vitamins Products (MG PLUS PROTEIN) 133 MG TABS, Take 133 mg by mouth daily., Disp: , Rfl:    tacrolimus (PROGRAF) 0.5 MG capsule, Take 1 mg by mouth 2 (two) times daily., Disp: , Rfl:   Past Medical History: Past Medical History:  Diagnosis Date   Cancer (HCC)     Tobacco Use: Social History   Tobacco Use  Smoking Status Never  Smokeless Tobacco Never    Labs: Review Flowsheet        No data to display          Capillary Blood Glucose: No results found for: "  GLUCAP"   Pulmonary Assessment Scores:  UCSD: Self-administered rating of dyspnea associated with activities of daily living (ADLs) 6-point scale (0 = "not at all" to 5 = "maximal or unable to do because of breathlessness")  Scoring Scores range from 0 to 120.  Minimally important difference is 5 units  CAT: CAT can identify the health impairment of COPD patients and is better correlated with disease progression.  CAT has a scoring range of zero to 40. The CAT score is classified into four groups of low (less than 10), medium (10 - 20), high (21-30) and very high (31-40) based on the impact level of disease on health status. A CAT score over 10 suggests significant symptoms.  A worsening CAT score could be explained by an exacerbation, poor  medication adherence, poor inhaler technique, or progression of COPD or comorbid conditions.  CAT MCID is 2 points  mMRC: mMRC (Modified Medical Research Council) Dyspnea Scale is used to assess the degree of baseline functional disability in patients of respiratory disease due to dyspnea. No minimal important difference is established. A decrease in score of 1 point or greater is considered a positive change.   Pulmonary Function Assessment:   Exercise Target Goals: Exercise Program Goal: Individual exercise prescription set using results from initial 6 min walk test and THRR while considering  patient's activity barriers and safety.   Exercise Prescription Goal: Initial exercise prescription builds to 30-45 minutes a day of aerobic activity, 2-3 days per week.  Home exercise guidelines will be given to patient during program as part of exercise prescription that the participant will acknowledge.  Activity Barriers & Risk Stratification:   6 Minute Walk:   Oxygen Initial Assessment:   Oxygen Re-Evaluation:   Oxygen Discharge (Final Oxygen Re-Evaluation):   Initial Exercise Prescription:   Perform Capillary Blood Glucose checks as needed.  Exercise Prescription Changes:   Exercise Comments:   Exercise Goals and Review:   Exercise Goals Re-Evaluation :   Discharge Exercise Prescription (Final Exercise Prescription Changes):   Nutrition:  Target Goals: Understanding of nutrition guidelines, daily intake of sodium 1500mg , cholesterol 200mg , calories 30% from fat and 7% or less from saturated fats, daily to have 5 or more servings of fruits and vegetables.  Biometrics:    Nutrition Therapy Plan and Nutrition Goals:   Nutrition Assessments:  MEDIFICTS Score Key: >=70 Need to make dietary changes  40-70 Heart Healthy Diet <= 40 Therapeutic Level Cholesterol Diet  Flowsheet Row PULMONARY REHAB OTHER RESP ORIENTATION from 04/05/2023 in [facility] CARDIAC  REHABILITATION  Picture Your Plate Total Score on Admission 57      Picture Your Plate Scores: <21 Unhealthy dietary pattern with much room for improvement. 41-50 Dietary pattern unlikely to meet recommendations for good health and room for improvement. 51-60 More healthful dietary pattern, with some room for improvement.  >60 Healthy dietary pattern, although there may be some specific behaviors that could be improved.    Nutrition Goals Re-Evaluation:   Nutrition Goals Discharge (Final Nutrition Goals Re-Evaluation):   Psychosocial: Target Goals: Acknowledge presence or absence of significant depression and/or stress, maximize coping skills, provide positive support system. Participant is able to verbalize types and ability to use techniques and skills needed for reducing stress and depression.  Initial Review & Psychosocial Screening:   Quality of Life Scores:  Scores of 19 and below usually indicate a poorer quality of life in these areas.  A difference of  2-3 points is a clinically meaningful difference.  A difference of 2-3 points in the total score of the Quality of Life Index has been associated with significant improvement in overall quality of life, self-image, physical symptoms, and general health in studies assessing change in quality of life.   PHQ-9: Review Flowsheet       04/05/2023  Depression screen PHQ 2/9  Decreased Interest 0  Down, Depressed, Hopeless 0  PHQ - 2 Score 0  Altered sleeping 3  Tired, decreased energy 1  Change in appetite 0  Feeling bad or failure about yourself  0  Trouble concentrating 0  Moving slowly or fidgety/restless 0  Suicidal thoughts 0  PHQ-9 Score 4  Difficult doing work/chores Not difficult at all   Interpretation of Total Score  Total Score Depression Severity:  1-4 = Minimal depression, 5-9 = Mild depression, 10-14 = Moderate depression, 15-19 = Moderately severe depression, 20-27 = Severe depression   Psychosocial  Evaluation and Intervention:   Psychosocial Re-Evaluation:   Psychosocial Discharge (Final Psychosocial Re-Evaluation):    Education: Education Goals: Education classes will be provided on a weekly basis, covering required topics. Participant will state understanding/return demonstration of topics presented.  Learning Barriers/Preferences:   Education Topics: How Lungs Work and Diseases: - Discuss the anatomy of the lungs and diseases that can affect the lungs, such as COPD.   Exercise: -Discuss the importance of exercise, FITT principles of exercise, normal and abnormal responses to exercise, and how to exercise safely.   Environmental Irritants: -Discuss types of environmental irritants and how to limit exposure to environmental irritants.   Meds/Inhalers and oxygen: - Discuss respiratory medications, definition of an inhaler and oxygen, and the proper way to use an inhaler and oxygen.   Energy Saving Techniques: - Discuss methods to conserve energy and decrease shortness of breath when performing activities of daily living.    Bronchial Hygiene / Breathing Techniques: - Discuss breathing mechanics, pursed-lip breathing technique,  proper posture, effective ways to clear airways, and other functional breathing techniques   Cleaning Equipment: - Provides group verbal and written instruction about the health risks of elevated stress, cause of high stress, and healthy ways to reduce stress.   Nutrition I: Fats: - Discuss the types of cholesterol, what cholesterol does to the body, and how cholesterol levels can be controlled.   Nutrition II: Labels: -Discuss the different components of food labels and how to read food labels.   Respiratory Infections: - Discuss the signs and symptoms of respiratory infections, ways to prevent respiratory infections, and the importance of seeking medical treatment when having a respiratory infection.   Stress I: Signs and  Symptoms: - Discuss the causes of stress, how stress may lead to anxiety and depression, and ways to limit stress.   Stress II: Relaxation: -Discuss relaxation techniques to limit stress.   Oxygen for Home/Travel: - Discuss how to prepare for travel when on oxygen and proper ways to transport and store oxygen to ensure safety.   Knowledge Questionnaire Score:   Core Components/Risk Factors/Patient Goals at Admission:   Core Components/Risk Factors/Patient Goals Review:    Core Components/Risk Factors/Patient Goals at Discharge (Final Review):    ITP Comments:  ITP Comments     Row Name 04/17/23 0736 05/11/23 1454 05/16/23 0830 06/13/23 0815     ITP Comments 30 day review completed. ITP sent to Dr.Jehanzeb Memon, Medical Director of  Pulmonary Rehab. Continue with ITP unless changes are made by physician.   Only completed orientation thus far. Called patient regarding not  attending pulmonary rehab. 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 He says his surgeon has told him not to return to rehab at this time. He is supposed to be discharged home with PT. He request wait at least a month to see if he can return to the program. We will hold until March 1 and touch base to see where he is in his recovery. Patient was good with this plan. 30 day review completed. ITP sent to Dr.Jehanzeb Memon, Medical Director of  Pulmonary Rehab. Continue with ITP unless changes are made by physician. Pt currently out on medical hold, has only completed orientation thus far. 30 day review completed. ITP sent to Dr.Jehanzeb Memon, Medical Director of  Pulmonary Rehab. Continue with ITP unless changes are made by physician. Pt currently out on medical hold, has only completed orientation thus far.             Comments: 30 day review

## 2023-06-14 ENCOUNTER — Ambulatory Visit: Payer: BC Managed Care – PPO

## 2023-06-15 ENCOUNTER — Encounter (HOSPITAL_COMMUNITY): Payer: BC Managed Care – PPO

## 2023-06-18 ENCOUNTER — Encounter (HOSPITAL_COMMUNITY): Payer: BC Managed Care – PPO

## 2023-06-19 ENCOUNTER — Ambulatory Visit: Payer: BC Managed Care – PPO | Admitting: Physical Therapy

## 2023-06-20 ENCOUNTER — Encounter (HOSPITAL_COMMUNITY): Payer: BC Managed Care – PPO

## 2023-06-22 ENCOUNTER — Ambulatory Visit: Payer: BC Managed Care – PPO | Admitting: Physical Therapy

## 2023-06-22 ENCOUNTER — Encounter (HOSPITAL_COMMUNITY): Payer: BC Managed Care – PPO

## 2023-06-25 ENCOUNTER — Encounter (HOSPITAL_COMMUNITY): Payer: BC Managed Care – PPO

## 2023-06-26 ENCOUNTER — Ambulatory Visit: Payer: BC Managed Care – PPO | Admitting: Physical Therapy

## 2023-06-27 ENCOUNTER — Encounter (HOSPITAL_COMMUNITY): Payer: BC Managed Care – PPO

## 2023-06-29 ENCOUNTER — Ambulatory Visit: Payer: BC Managed Care – PPO

## 2023-06-29 ENCOUNTER — Encounter (HOSPITAL_COMMUNITY): Payer: BC Managed Care – PPO

## 2023-07-02 ENCOUNTER — Encounter (HOSPITAL_COMMUNITY): Payer: BC Managed Care – PPO

## 2023-07-02 ENCOUNTER — Encounter (HOSPITAL_COMMUNITY): Payer: Self-pay

## 2023-07-03 ENCOUNTER — Encounter (HOSPITAL_COMMUNITY): Payer: Self-pay

## 2023-07-03 ENCOUNTER — Ambulatory Visit: Payer: BC Managed Care – PPO

## 2023-07-03 DIAGNOSIS — J9601 Acute respiratory failure with hypoxia: Secondary | ICD-10-CM

## 2023-07-03 DIAGNOSIS — J84116 Cryptogenic organizing pneumonia: Secondary | ICD-10-CM

## 2023-07-03 NOTE — Progress Notes (Signed)
 Discharge Progress Report  Patient Details  Name: Zachary Henry MRN: [id number] Date of Birth: May 30, 1985 Referring Provider:   Flowsheet Row PULMONARY REHAB OTHER RESP ORIENTATION from 04/05/2023 in [facility] CARDIAC REHABILITATION  Referring Provider wofford, anne MD        Number of Visits: 1  Reason for Discharge:  Early Exit:  pt has had occurring medical complications and has not been able to attend   Smoking History:  Social History   Tobacco Use  Smoking Status Never  Smokeless Tobacco Never    Diagnosis:  Acute respiratory failure with hypoxia (HCC)  Cryptogenic organizing pneumonia (HCC)  ADL UCSD:   Initial Exercise Prescription:   Discharge Exercise Prescription (Final Exercise Prescription Changes):   Functional Capacity:   Psychological, QOL, Others - Outcomes: PHQ 2/9:    04/05/2023    2:49 PM  Depression screen PHQ 2/9  Decreased Interest 0  Down, Depressed, Hopeless 0  PHQ - 2 Score 0  Altered sleeping 3  Tired, decreased energy 1  Change in appetite 0  Feeling bad or failure about yourself  0  Trouble concentrating 0  Moving slowly or fidgety/restless 0  Suicidal thoughts 0  PHQ-9 Score 4  Difficult doing work/chores Not difficult at all     Goals reviewed with patient; copy given to patient.

## 2023-07-03 NOTE — Progress Notes (Signed)
 Pulmonary Individual Treatment Plan  Patient Details  Name: Zachary Henry MRN: [id number] Date of Birth: [birth year] Referring Provider:   Flowsheet Row PULMONARY REHAB OTHER RESP ORIENTATION from 04/05/2023 in [facility]ARD[facility]AC REH[facility]ATION  Referring Provider [email or web address], anne MD       Initial Encounter Date:  Flowsheet Row PULMONARY REHAB OTHER RESP ORIENTATION from 04/05/2023 in [facility]ARDIAC REH[facility]  Date 04/05/23       Visit Diagnosis: Acute respiratory failure with hypoxia (HCC)  Cryptogenic organizing pneumonia (HCC)  Patient's Home Medications on Admission:   Current Outpatient Medications:    acetaminophen (TYLENOL) 325 MG tablet, Take 650 mg by mouth every 4 (four) hours as needed for moderate pain (pain score 4-6)., Disp: , Rfl:    acyclovir (ZOVIRAX) 800 MG tablet, Take 800 mg by mouth 2 (two) times daily., Disp: , Rfl:    albuterol (VENTOLIN HFA) 108 (90 Base) MCG/ACT inhaler, Inhale 2 puffs into the lungs every 4 (four) hours as needed (SOB)., Disp: , Rfl:    chlorhexidine (PERIDEX) 0.12 % solution, Use as directed 5 mLs in the mouth or throat 2 (two) times daily., Disp: , Rfl:    CRESEMBA 186 MG CAPS, Take 2 capsules by mouth daily., Disp: , Rfl:    famotidine (PEPCID) 20 MG tablet, Take 20 mg by mouth daily as needed for heartburn., Disp: , Rfl:    fluticasone-salmeterol (ADVAIR) 250-50 MCG/ACT AEPB, Inhale 1 puff into the lungs in the morning and at bedtime., Disp: , Rfl:    folic acid (FOLVITE) 1 MG tablet, Take 1 mg by mouth daily., Disp: , Rfl:    Insulin Glargine (BASAGLAR KWIKPEN) 100 UNIT/ML, Inject 12 Units into the skin 2 (two) times daily., Disp: , Rfl:    insulin lispro (HUMALOG) 100 UNIT/ML KwikPen, Inject 6 Units into the skin 3 (three) times daily. Sliding scale, Disp: , Rfl:    metoprolol succinate (TOPROL-XL) 25 MG 24 hr tablet, Take 1 tablet by mouth daily., Disp: , Rfl:    Multiple Vitamin (MULTI-VITAMIN) tablet, Take 1 tablet by  mouth daily., Disp: , Rfl:    ondansetron (ZOFRAN-ODT) 8 MG disintegrating tablet, Take 8 mg by mouth every 8 (eight) hours as needed for nausea., Disp: , Rfl:    oxymetazoline (AFRIN) 0.05 % nasal spray, Place 2 sprays into the nose 2 (two) times daily as needed (Nose bleed)., Disp: , Rfl:    penicillin v potassium (VEETID) 500 MG tablet, Take 500 mg by mouth 2 (two) times daily., Disp: , Rfl:    predniSONE (DELTASONE) 10 MG tablet, Take 80 mg by mouth 2 (two) times daily., Disp: , Rfl:    PREVYMIS 480 MG TABS, Take 1 tablet by mouth daily., Disp: , Rfl:    SODIUM FLUORIDE 5000 PPM 1.1 % GEL dental gel, Place 1 Application onto teeth at bedtime., Disp: , Rfl:    Specialty Vitamins Products (MG PLUS PROTEIN) 133 MG TABS, Take 133 mg by mouth daily., Disp: , Rfl:    tacrolimus (PROGRAF) 0.5 MG capsule, Take 1 mg by mouth 2 (two) times daily., Disp: , Rfl:   Past Medical History: Past Medical History:  Diagnosis Date   Cancer (HCC)     Tobacco Use: Social History   Tobacco Use  Smoking Status Never  Smokeless Tobacco Never    Labs: Review Flowsheet        No data to display          Capillary Blood Glucose: No results found for: "  GLUCAP"   Pulmonary Assessment Scores:  UCSD: Self-administered rating of dyspnea associated with activities of daily living (ADLs) 6-point scale (0 = "not at all" to 5 = "maximal or unable to do because of breathlessness")  Scoring Scores range from 0 to 120.  Minimally important difference is 5 units  CAT: CAT can identify the health impairment of COPD patients and is better correlated with disease progression.  CAT has a scoring range of zero to 40. The CAT score is classified into four groups of low (less than 10), medium (10 - 20), high (21-30) and very high (31-40) based on the impact level of disease on health status. A CAT score over 10 suggests significant symptoms.  A worsening CAT score could be explained by an exacerbation, poor  medication adherence, poor inhaler technique, or progression of COPD or comorbid conditions.  CAT MCID is 2 points  mMRC: mMRC (Modified Medical Research Council) Dyspnea Scale is used to assess the degree of baseline functional disability in patients of respiratory disease due to dyspnea. No minimal important difference is established. A decrease in score of 1 point or greater is considered a positive change.   Pulmonary Function Assessment:   Exercise Target Goals: Exercise Program Goal: Individual exercise prescription set using results from initial 6 min walk test and THRR while considering  patient's activity barriers and safety.   Exercise Prescription Goal: Initial exercise prescription builds to 30-45 minutes a day of aerobic activity, 2-3 days per week.  Home exercise guidelines will be given to patient during program as part of exercise prescription that the participant will acknowledge.  Activity Barriers & Risk Stratification:   6 Minute Walk:   Oxygen Initial Assessment:   Oxygen Re-Evaluation:   Oxygen Discharge (Final Oxygen Re-Evaluation):   Initial Exercise Prescription:   Perform Capillary Blood Glucose checks as needed.  Exercise Prescription Changes:   Exercise Comments:   Exercise Goals and Review:   Exercise Goals Re-Evaluation :   Discharge Exercise Prescription (Final Exercise Prescription Changes):   Nutrition:  Target Goals: Understanding of nutrition guidelines, daily intake of sodium 1500mg , cholesterol 200mg , calories 30% from fat and 7% or less from saturated fats, daily to have 5 or more servings of fruits and vegetables.  Biometrics:    Nutrition Therapy Plan and Nutrition Goals:   Nutrition Assessments:  MEDIFICTS Score Key: >=70 Need to make dietary changes  40-70 Heart Healthy Diet <= 40 Therapeutic Level Cholesterol Diet  Flowsheet Row PULMONARY REHAB OTHER RESP ORIENTATION from 04/05/2023 in [facility] CARDI[facility]  REHABILITATION  Picture Your Plate Total Score on Admission 57      Picture Your Plate Scores: <73 Unhealthy dietary pattern with much room for improvement. 41-50 Dietary pattern unlikely to meet recommendations for good health and room for improvement. 51-60 More healthful dietary pattern, with some room for improvement.  >60 Healthy dietary pattern, although there may be some specific behaviors that could be improved.    Nutrition Goals Re-Evaluation:   Nutrition Goals Discharge (Final Nutrition Goals Re-Evaluation):   Psychosocial: Target Goals: Acknowledge presence or absence of significant depression and/or stress, maximize coping skills, provide positive support system. Participant is able to verbalize types and ability to use techniques and skills needed for reducing stress and depression.  Initial Review & Psychosocial Screening:   Quality of Life Scores:  Scores of 19 and below usually indicate a poorer quality of life in these areas.  A difference of  2-3 points is a clinically meaningful difference.  A difference of 2-3 points in the total score of the Quality of Life Index has been associated with significant improvement in overall quality of life, self-image, physical symptoms, and general health in studies assessing change in quality of life.   PHQ-9: Review Flowsheet       04/05/2023  Depression screen PHQ 2/9  Decreased Interest 0  Down, Depressed, Hopeless 0  PHQ - 2 Score 0  Altered sleeping 3  Tired, decreased energy 1  Change in appetite 0  Feeling bad or failure about yourself  0  Trouble concentrating 0  Moving slowly or fidgety/restless 0  Suicidal thoughts 0  PHQ-9 Score 4  Difficult doing work/chores Not difficult at all   Interpretation of Total Score  Total Score Depression Severity:  1-4 = Minimal depression, 5-9 = Mild depression, 10-14 = Moderate depression, 15-19 = Moderately severe depression, 20-27 = Severe depression   Psychosocial  Evaluation and Intervention:   Psychosocial Re-Evaluation:   Psychosocial Discharge (Final Psychosocial Re-Evaluation):    Education: Education Goals: Education classes will be provided on a weekly basis, covering required topics. Participant will state understanding/return demonstration of topics presented.  Learning Barriers/Preferences:   Education Topics: How Lungs Work and Diseases: - Discuss the anatomy of the lungs and diseases that can affect the lungs, such as COPD.   Exercise: -Discuss the importance of exercise, FITT principles of exercise, normal and abnormal responses to exercise, and how to exercise safely.   Environmental Irritants: -Discuss types of environmental irritants and how to limit exposure to environmental irritants.   Meds/Inhalers and oxygen: - Discuss respiratory medications, definition of an inhaler and oxygen, and the proper way to use an inhaler and oxygen.   Energy Saving Techniques: - Discuss methods to conserve energy and decrease shortness of breath when performing activities of daily living.    Bronchial Hygiene / Breathing Techniques: - Discuss breathing mechanics, pursed-lip breathing technique,  proper posture, effective ways to clear airways, and other functional breathing techniques   Cleaning Equipment: - Provides group verbal and written instruction about the health risks of elevated stress, cause of high stress, and healthy ways to reduce stress.   Nutrition I: Fats: - Discuss the types of cholesterol, what cholesterol does to the body, and how cholesterol levels can be controlled.   Nutrition II: Labels: -Discuss the different components of food labels and how to read food labels.   Respiratory Infections: - Discuss the signs and symptoms of respiratory infections, ways to prevent respiratory infections, and the importance of seeking medical treatment when having a respiratory infection.   Stress I: Signs and  Symptoms: - Discuss the causes of stress, how stress may lead to anxiety and depression, and ways to limit stress.   Stress II: Relaxation: -Discuss relaxation techniques to limit stress.   Oxygen for Home/Travel: - Discuss how to prepare for travel when on oxygen and proper ways to transport and store oxygen to ensure safety.   Knowledge Questionnaire Score:   Core Components/Risk Factors/Patient Goals at Admission:   Core Components/Risk Factors/Patient Goals Review:    Core Components/Risk Factors/Patient Goals at Discharge (Final Review):    ITP Comments:  ITP Comments     Row Name 05/11/23 1454 05/16/23 0830 06/13/23 0815       ITP Comments Called patient regarding not attending pulmonary rehab. 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 He says his surgeon has told him not to return to rehab at this time. He is supposed to  be discharged home with PT. He request wait at least a month to see if he can return to the program. We will hold until March 1 and touch base to see where he is in his recovery. Patient was good with this plan. 30 day review completed. ITP sent to Dr.Jehanzeb Memon, Medical Director of  Pulmonary Rehab. Continue with ITP unless changes are made by physician. Pt currently out on medical hold, has only completed orientation thus far. 30 day review completed. ITP sent to Dr.Jehanzeb Memon, Medical Director of  Pulmonary Rehab. Continue with ITP unless changes are made by physician. Pt currently out on medical hold, has only completed orientation thus far.              Comments: Discharge ITP

## 2023-07-04 ENCOUNTER — Encounter (HOSPITAL_COMMUNITY): Payer: BC Managed Care – PPO

## 2023-07-06 ENCOUNTER — Ambulatory Visit: Payer: BC Managed Care – PPO | Admitting: Physical Therapy

## 2023-07-10 ENCOUNTER — Ambulatory Visit: Payer: BC Managed Care – PPO | Admitting: Physical Therapy

## 2023-07-13 ENCOUNTER — Ambulatory Visit: Payer: BC Managed Care – PPO
